# Patient Record
Sex: Male | Born: 1960 | Race: Black or African American | Hispanic: No | Marital: Married | Smoking: Never smoker
Health system: Southern US, Community
[De-identification: ages and names within clinical notes are randomized; demographics above are authoritative.]

## PROBLEM LIST (undated history)

## (undated) DIAGNOSIS — I10 Essential (primary) hypertension: Secondary | ICD-10-CM

## (undated) DIAGNOSIS — E119 Type 2 diabetes mellitus without complications: Secondary | ICD-10-CM

---

## 2008-08-03 ENCOUNTER — Emergency Department (HOSPITAL_COMMUNITY): Admission: EM | Admit: 2008-08-03 | Discharge: 2008-08-03 | Payer: Self-pay | Admitting: Emergency Medicine

## 2016-01-05 ENCOUNTER — Ambulatory Visit (INDEPENDENT_AMBULATORY_CARE_PROVIDER_SITE_OTHER): Payer: PRIVATE HEALTH INSURANCE | Admitting: Family Medicine

## 2016-01-05 VITALS — BP 128/80 | HR 84 | Temp 99.4°F | Resp 16 | Ht 70.0 in | Wt 207.0 lb

## 2016-01-05 DIAGNOSIS — R112 Nausea with vomiting, unspecified: Secondary | ICD-10-CM | POA: Diagnosis not present

## 2016-01-05 DIAGNOSIS — R197 Diarrhea, unspecified: Secondary | ICD-10-CM | POA: Diagnosis not present

## 2016-01-05 MED ORDER — ONDANSETRON 4 MG PO TBDP
4.0000 mg | ORAL_TABLET | Freq: Once | ORAL | Status: AC
  Administered 2016-01-05: 4 mg via ORAL

## 2016-01-05 MED ORDER — ONDANSETRON 4 MG PO TBDP: 4.0000 mg | ORAL_TABLET | Freq: Three times a day (TID) | ORAL | Status: DC | PRN

## 2016-01-05 NOTE — Progress Notes (Addendum)
Subjective:    Patient ID: Benjamin Munoz, male    DOB: 1961-08-23, 55 y.o.   MRN: [id number] By signing my name below, I, Benjamin Munoz, attest that this documentation has been prepared under the direction and in the presence of Benjamin Staggers, MD.  Electronically Signed: Littie Munoz, Medical Scribe. 01/05/2016. 9:55 AM.  HPI HPI Comments: Benjamin Munoz is a 55 y.o. male who presents to the [facility] complaining of vomiting that started 2 nights ago. Patient reports having associated diarrhea and abdominal soreness. He also reports having chills initially. He estimates vomiting 6 times yesterday and had about 8 episodes of diarrhea yesterday. He had a single episode of vomiting today around 3:30 AM and a single episode of diarrhea today around 6:00 AM. His symptoms have been improving overall. Patient has not been able to hold down fluids, but he was able to take small sips of Gatorade today. He has not tried any medications. Patient denies fever, difficulty urinating, and lightheadedness. He last urinated this morning, which was normal. He missed work yesterday due to his symptoms. He is concerned he may had food poisoning from fruit that he ate.  Patient works at [employer].  There are no active problems to display for this patient.  History reviewed. No pertinent past medical history. History reviewed. No pertinent past surgical history. No Known Allergies Prior to Admission medications   Not on File   Social History   Social History  . Marital Status: Married    Spouse Name: N/A  . Number of Children: N/A  . Years of Education: N/A   Occupational History  . Not on file.   Social History Main Topics  . Smoking status: Never Smoker   . Smokeless tobacco: Not on file  . Alcohol Use: Not on file  . Drug Use: Not on file  . Sexual Activity: Not on file   Other Topics Concern  . Not on file   Social History Narrative  . No narrative on file       Review of Systems  Constitutional: Positive for chills. Negative for fever.  Gastrointestinal: Positive for vomiting, abdominal pain and diarrhea.  Genitourinary: Negative for difficulty urinating.  Neurological: Negative for light-headedness.       Objective:   Physical Exam  Constitutional: He is oriented to person, place, and time. He appears well-developed and well-nourished. No distress.  HENT:  Head: Normocephalic and atraumatic.  Mouth/Throat: Oropharynx is clear and moist. No oropharyngeal exudate.  Eyes: Pupils are equal, round, and reactive to light.  Neck: Neck supple.  Cardiovascular: Normal rate, regular rhythm and normal heart sounds.  Exam reveals no gallop and no friction rub.   No murmur heard. Pulmonary/Chest: Effort normal and breath sounds normal. No respiratory distress. He has no wheezes.  Clear to auscultation bilaterally.   Abdominal: There is generalized tenderness. There is no rebound, no guarding and negative Murphy's sign.  Diffuse general slight tenderness, no focal tenderness.   Musculoskeletal: He exhibits no edema.  Neurological: He is alert and oriented to person, place, and time. No cranial nerve deficit.  Skin: Skin is warm and dry. No rash noted.  Psychiatric: He has a normal mood and affect. His behavior is normal.  Nursing note and vitals reviewed.   Filed Vitals:   01/05/16 0929  BP: 128/80  Pulse: 84  Temp: 99.4 F (37.4 C)  TempSrc: Oral  Resp: 16  Height: 5\' 10"  (1.778 m)  Weight:  207 lb (93.895 kg)  SpO2: 97%         Assessment & Plan:   Benjamin Munoz is a 55 y.o. male Non-intractable vomiting with nausea, vomiting of unspecified type - Plan: ondansetron (ZOFRAN ODT) 4 MG disintegrating tablet  Diarrhea, unspecified type  Suspected viral gastroenteritis, with symptomatic improvement today. Appears significantly hydrated.   -Symptomatic care discussed with Zofran if needed, initial dose given in office.   -ORT with  frequent sips of small amounts of fluid, rest and slow resumption of regular diet as improves.   -. RTC precautions if persistent abdominal pain, return of fevers or worsening vomiting, diarrhea, or abdominal pain. Understanding expressed.   Meds ordered this encounter  Medications  . ondansetron (ZOFRAN ODT) 4 MG disintegrating tablet    Sig: Take 1 tablet (4 mg total) by mouth every 8 (eight) hours as needed for nausea or vomiting.    Dispense:  10 tablet    Refill:  0   Patient Instructions  You appear to have a virus causing the vomiting and diarrhea. As it is improving, can hold off on other testing at this time. You can take Zofran up to every 8 hours as needed for nausea and vomiting, small sips of fluids but do this frequently. Out of work for today, and return tomorrow as long as you are continuing to improve. If you are having fevers, increasing abdominal pain, or otherwise worsening, return for recheck here or the emergency room.  Gastroenteritis:  Diarrhea Infections caused by germs (bacterial) or a virus commonly cause diarrhea. Your caregiver has determined that with time, rest and fluids, the diarrhea should improve. In general, eat normally while drinking more water than usual. Although water may prevent dehydration, it does not contain salt and minerals (electrolytes). Broths, weak tea without caffeine and oral rehydration solutions (ORS) replace fluids and electrolytes. Small amounts of fluids should be taken frequently. Large amounts at one time may not be tolerated. Plain water may be harmful in infants and the elderly. Oral rehydrating solutions (ORS) are available at pharmacies and grocery stores. ORS replace water and important electrolytes in proper proportions. Sports drinks are not as effective as ORS and may be harmful due to sugars worsening diarrhea.  ORS is especially recommended for use in children with diarrhea. As a general guideline for children, replace any new  fluid losses from diarrhea and/or vomiting with ORS as follows:   If your child weighs 22 pounds or under (10 kg or less), give 60-120 mL ( -  cup or 2 - 4 ounces) of ORS for each episode of diarrheal stool or vomiting episode.   If your child weighs more than 22 pounds (more than 10 kgs), give 120-240 mL ( - 1 cup or 4 - 8 ounces) of ORS for each diarrheal stool or episode of vomiting.   While correcting for dehydration, children should eat normally. However, foods high in sugar should be avoided because this may worsen diarrhea. Large amounts of carbonated soft drinks, juice, gelatin desserts and other highly sugared drinks should be avoided.   After correction of dehydration, other liquids that are appealing to the child may be added. Children should drink small amounts of fluids frequently and fluids should be increased as tolerated. Children should drink enough fluids to keep urine clear or pale yellow.   Adults should eat normally while drinking more fluids than usual. Drink small amounts of fluids frequently and increase as tolerated. Drink enough fluids to keep  urine clear or pale yellow. Broths, weak decaffeinated tea, lemon lime soft drinks (allowed to go flat) and ORS replace fluids and electrolytes.   Avoid:   Carbonated drinks.   Juice.   Extremely hot or cold fluids.   Caffeine drinks.   Fatty, greasy foods.   Alcohol.   Tobacco.   Too much intake of anything at one time.   Gelatin desserts.   Probiotics are active cultures of beneficial bacteria. They may lessen the amount and number of diarrheal stools in adults. Probiotics can be found in yogurt with active cultures and in supplements.   Wash hands well to avoid spreading bacteria and virus.   Anti-diarrheal medications are not recommended for infants and children.   Only take over-the-counter or prescription medicines for pain, discomfort or fever as directed by your caregiver. Do not give aspirin to  children because it may cause Reye's Syndrome.   For adults, ask your caregiver if you should continue all prescribed and over-the-counter medicines.   If your caregiver has given you a follow-up appointment, it is very important to keep that appointment. Not keeping the appointment could result in a chronic or permanent injury, and disability. If there is any problem keeping the appointment, you must call back to this facility for assistance.  SEEK IMMEDIATE MEDICAL CARE IF:   You or your child is unable to keep fluids down or other symptoms or problems become worse in spite of treatment.   Vomiting or diarrhea develops and becomes persistent.   There is vomiting of blood or bile (green material).   There is blood in the stool or the stools are black and tarry.   There is no urine output in 6-8 hours or there is only a small amount of very dark urine.   Abdominal pain develops, increases or localizes.   You have a fever.   Your baby is older than 3 months with a rectal temperature of 102 F (38.9 C) or higher.   Your baby is 31 months old or younger with a rectal temperature of 100.4 F (38 C) or higher.   You or your child develops excessive weakness, dizziness, fainting or extreme thirst.   You or your child develops a rash, stiff neck, severe headache or become irritable or sleepy and difficult to awaken.  MAKE SURE YOU:   Understand these instructions.   Will watch your condition.   Will get help right away if you are not doing well or get worse.  Document Released: 11/04/2002 Document Revised: 11/03/2011 Document Reviewed: 09/21/2009 [facility] Patient Information 2012 [place], [place].  Nausea and Vomiting Nausea is a sick feeling that often comes before throwing up (vomiting). Vomiting is a reflex where stomach contents come out of your mouth. Vomiting can cause severe loss of body fluids (dehydration). Children and elderly adults can become dehydrated quickly, especially  if they also have diarrhea. Nausea and vomiting are symptoms of a condition or disease. It is important to find the cause of your symptoms. CAUSES   Direct irritation of the stomach lining. This irritation can result from increased acid production (gastroesophageal reflux disease), infection, food poisoning, taking certain medicines (such as nonsteroidal anti-inflammatory drugs), alcohol use, or tobacco use.   Signals from the brain.These signals could be caused by a headache, heat exposure, an inner ear disturbance, increased pressure in the brain from injury, infection, a tumor, or a concussion, pain, emotional stimulus, or metabolic problems.   An obstruction in the gastrointestinal tract (bowel obstruction).  Illnesses such as diabetes, hepatitis, gallbladder problems, appendicitis, kidney problems, cancer, sepsis, atypical symptoms of a heart attack, or eating disorders.   Medical treatments such as chemotherapy and radiation.   Receiving medicine that makes you sleep (general anesthetic) during surgery.  DIAGNOSIS Your caregiver may ask for tests to be done if the problems do not improve after a few days. Tests may also be done if symptoms are severe or if the reason for the nausea and vomiting is not clear. Tests may include:  Urine tests.   Blood tests.   Stool tests.   Cultures (to look for evidence of infection).   X-rays or other imaging studies.  Test results can help your caregiver make decisions about treatment or the need for additional tests. TREATMENT You need to stay well hydrated. Drink frequently but in small amounts.You may wish to drink water, sports drinks, clear broth, or eat frozen ice pops or gelatin dessert to help stay hydrated.When you eat, eating slowly may help prevent nausea.There are also some antinausea medicines that may help prevent nausea. HOME CARE INSTRUCTIONS   Take all medicine as directed by your caregiver.   If you do not have an  appetite, do not force yourself to eat. However, you must continue to drink fluids.   If you have an appetite, eat a normal diet unless your caregiver tells you differently.   Eat a variety of complex carbohydrates (rice, wheat, potatoes, bread), lean meats, yogurt, fruits, and vegetables.   Avoid high-fat foods because they are more difficult to digest.   Drink enough water and fluids to keep your urine clear or pale yellow.   If you are dehydrated, ask your caregiver for specific rehydration instructions. Signs of dehydration may include:   Severe thirst.   Dry lips and mouth.   Dizziness.   Dark urine.   Decreasing urine frequency and amount.   Confusion.   Rapid breathing or pulse.  SEEK IMMEDIATE MEDICAL CARE IF:   You have blood or brown flecks (like coffee grounds) in your vomit.   You have black or bloody stools.   You have a severe headache or stiff neck.   You are confused.   You have severe abdominal pain.   You have chest pain or trouble breathing.   You do not urinate at least once every 8 hours.   You develop cold or clammy skin.   You continue to vomit for longer than 24 to 48 hours.   You have a fever.  MAKE SURE YOU:   Understand these instructions.   Will watch your condition.   Will get help right away if you are not doing well or get worse.  Document Released: 11/14/2005 Document Revised: 11/03/2011 Document Reviewed: 04/13/2011 [facility] Patient Information 2012 [place], [place].  Return to the clinic or go to the nearest emergency room if any of your symptoms worsen or new symptoms occur.      I personally performed the services described in this documentation, which was scribed in my presence. The recorded information has been reviewed and considered, and addended by me as needed.

## 2016-01-05 NOTE — Addendum Note (Signed)
Addended by: Meredith Staggers R on: 01/05/2016 10:47 AM   Modules accepted: Orders

## 2016-01-05 NOTE — Patient Instructions (Signed)
You appear to have a virus causing the vomiting and diarrhea. As it is improving, can hold off on other testing at this time. You can take Zofran up to every 8 hours as needed for nausea and vomiting, small sips of fluids but do this frequently. Out of work for today, and return tomorrow as long as you are continuing to improve. If you are having fevers, increasing abdominal pain, or otherwise worsening, return for recheck here or the emergency room.  Gastroenteritis:  Diarrhea Infections caused by germs (bacterial) or a virus commonly cause diarrhea. Your caregiver has determined that with time, rest and fluids, the diarrhea should improve. In general, eat normally while drinking more water than usual. Although water may prevent dehydration, it does not contain salt and minerals (electrolytes). Broths, weak tea without caffeine and oral rehydration solutions (ORS) replace fluids and electrolytes. Small amounts of fluids should be taken frequently. Large amounts at one time may not be tolerated. Plain water may be harmful in infants and the elderly. Oral rehydrating solutions (ORS) are available at pharmacies and grocery stores. ORS replace water and important electrolytes in proper proportions. Sports drinks are not as effective as ORS and may be harmful due to sugars worsening diarrhea.  ORS is especially recommended for use in children with diarrhea. As a general guideline for children, replace any new fluid losses from diarrhea and/or vomiting with ORS as follows:   If your child weighs 22 pounds or under (10 kg or less), give 60-120 mL ( -  cup or 2 - 4 ounces) of ORS for each episode of diarrheal stool or vomiting episode.   If your child weighs more than 22 pounds (more than 10 kgs), give 120-240 mL ( - 1 cup or 4 - 8 ounces) of ORS for each diarrheal stool or episode of vomiting.   While correcting for dehydration, children should eat normally. However, foods high in sugar should be avoided  because this may worsen diarrhea. Large amounts of carbonated soft drinks, juice, gelatin desserts and other highly sugared drinks should be avoided.   After correction of dehydration, other liquids that are appealing to the child may be added. Children should drink small amounts of fluids frequently and fluids should be increased as tolerated. Children should drink enough fluids to keep urine clear or pale yellow.   Adults should eat normally while drinking more fluids than usual. Drink small amounts of fluids frequently and increase as tolerated. Drink enough fluids to keep urine clear or pale yellow. Broths, weak decaffeinated tea, lemon lime soft drinks (allowed to go flat) and ORS replace fluids and electrolytes.   Avoid:   Carbonated drinks.   Juice.   Extremely hot or cold fluids.   Caffeine drinks.   Fatty, greasy foods.   Alcohol.   Tobacco.   Too much intake of anything at one time.   Gelatin desserts.   Probiotics are active cultures of beneficial bacteria. They may lessen the amount and number of diarrheal stools in adults. Probiotics can be found in yogurt with active cultures and in supplements.   Wash hands well to avoid spreading bacteria and virus.   Anti-diarrheal medications are not recommended for infants and children.   Only take over-the-counter or prescription medicines for pain, discomfort or fever as directed by your caregiver. Do not give aspirin to children because it may cause Reye's Syndrome.   For adults, ask your caregiver if you should continue all prescribed and over-the-counter medicines.   If  your caregiver has given you a follow-up appointment, it is very important to keep that appointment. Not keeping the appointment could result in a chronic or permanent injury, and disability. If there is any problem keeping the appointment, you must call back to this facility for assistance.  SEEK IMMEDIATE MEDICAL CARE IF:   You or your child is unable  to keep fluids down or other symptoms or problems become worse in spite of treatment.   Vomiting or diarrhea develops and becomes persistent.   There is vomiting of blood or bile (green material).   There is blood in the stool or the stools are black and tarry.   There is no urine output in 6-8 hours or there is only a small amount of very dark urine.   Abdominal pain develops, increases or localizes.   You have a fever.   Your baby is older than 3 months with a rectal temperature of 102 F (38.9 C) or higher.   Your baby is 51 months old or younger with a rectal temperature of 100.4 F (38 C) or higher.   You or your child develops excessive weakness, dizziness, fainting or extreme thirst.   You or your child develops a rash, stiff neck, severe headache or become irritable or sleepy and difficult to awaken.  MAKE SURE YOU:   Understand these instructions.   Will watch your condition.   Will get help right away if you are not doing well or get worse.  Document Released: 11/04/2002 Document Revised: 11/03/2011 Document Reviewed: 09/21/2009 [facility] Patient Information 2012 [place], [place].  Nausea and Vomiting Nausea is a sick feeling that often comes before throwing up (vomiting). Vomiting is a reflex where stomach contents come out of your mouth. Vomiting can cause severe loss of body fluids (dehydration). Children and elderly adults can become dehydrated quickly, especially if they also have diarrhea. Nausea and vomiting are symptoms of a condition or disease. It is important to find the cause of your symptoms. CAUSES   Direct irritation of the stomach lining. This irritation can result from increased acid production (gastroesophageal reflux disease), infection, food poisoning, taking certain medicines (such as nonsteroidal anti-inflammatory drugs), alcohol use, or tobacco use.   Signals from the brain.These signals could be caused by a headache, heat exposure, an inner ear  disturbance, increased pressure in the brain from injury, infection, a tumor, or a concussion, pain, emotional stimulus, or metabolic problems.   An obstruction in the gastrointestinal tract (bowel obstruction).   Illnesses such as diabetes, hepatitis, gallbladder problems, appendicitis, kidney problems, cancer, sepsis, atypical symptoms of a heart attack, or eating disorders.   Medical treatments such as chemotherapy and radiation.   Receiving medicine that makes you sleep (general anesthetic) during surgery.  DIAGNOSIS Your caregiver may ask for tests to be done if the problems do not improve after a few days. Tests may also be done if symptoms are severe or if the reason for the nausea and vomiting is not clear. Tests may include:  Urine tests.   Blood tests.   Stool tests.   Cultures (to look for evidence of infection).   X-rays or other imaging studies.  Test results can help your caregiver make decisions about treatment or the need for additional tests. TREATMENT You need to stay well hydrated. Drink frequently but in small amounts.You may wish to drink water, sports drinks, clear broth, or eat frozen ice pops or gelatin dessert to help stay hydrated.When you eat, eating slowly may help  prevent nausea.There are also some antinausea medicines that may help prevent nausea. HOME CARE INSTRUCTIONS   Take all medicine as directed by your caregiver.   If you do not have an appetite, do not force yourself to eat. However, you must continue to drink fluids.   If you have an appetite, eat a normal diet unless your caregiver tells you differently.   Eat a variety of complex carbohydrates (rice, wheat, potatoes, bread), lean meats, yogurt, fruits, and vegetables.   Avoid high-fat foods because they are more difficult to digest.   Drink enough water and fluids to keep your urine clear or pale yellow.   If you are dehydrated, ask your caregiver for specific rehydration instructions.  Signs of dehydration may include:   Severe thirst.   Dry lips and mouth.   Dizziness.   Dark urine.   Decreasing urine frequency and amount.   Confusion.   Rapid breathing or pulse.  SEEK IMMEDIATE MEDICAL CARE IF:   You have blood or brown flecks (like coffee grounds) in your vomit.   You have black or bloody stools.   You have a severe headache or stiff neck.   You are confused.   You have severe abdominal pain.   You have chest pain or trouble breathing.   You do not urinate at least once every 8 hours.   You develop cold or clammy skin.   You continue to vomit for longer than 24 to 48 hours.   You have a fever.  MAKE SURE YOU:   Understand these instructions.   Will watch your condition.   Will get help right away if you are not doing well or get worse.  Document Released: 11/14/2005 Document Revised: 11/03/2011 Document Reviewed: 04/13/2011 [facility] Patient Information 2012 [place], [place].  Return to the clinic or go to the nearest emergency room if any of your symptoms worsen or new symptoms occur.

## 2016-01-11 ENCOUNTER — Encounter: Payer: Self-pay | Admitting: *Deleted

## 2018-01-09 ENCOUNTER — Encounter (HOSPITAL_COMMUNITY): Payer: Self-pay

## 2018-01-09 ENCOUNTER — Emergency Department (HOSPITAL_COMMUNITY)
Admission: EM | Admit: 2018-01-09 | Discharge: 2018-01-09 | Disposition: A | Discharge status: Home / Self Care | Payer: No Typology Code available for payment source | Attending: Emergency Medicine | Admitting: Emergency Medicine

## 2018-01-09 DIAGNOSIS — Y92410 Unspecified street and highway as the place of occurrence of the external cause: Secondary | ICD-10-CM | POA: Diagnosis not present

## 2018-01-09 DIAGNOSIS — S299XXA Unspecified injury of thorax, initial encounter: Secondary | ICD-10-CM | POA: Diagnosis not present

## 2018-01-09 DIAGNOSIS — Y998 Other external cause status: Secondary | ICD-10-CM | POA: Diagnosis not present

## 2018-01-09 DIAGNOSIS — T148XXA Other injury of unspecified body region, initial encounter: Secondary | ICD-10-CM

## 2018-01-09 DIAGNOSIS — Y9389 Activity, other specified: Secondary | ICD-10-CM | POA: Diagnosis not present

## 2018-01-09 DIAGNOSIS — W2210XA Striking against or struck by unspecified automobile airbag, initial encounter: Secondary | ICD-10-CM | POA: Insufficient documentation

## 2018-01-09 DIAGNOSIS — S46912A Strain of unspecified muscle, fascia and tendon at shoulder and upper arm level, left arm, initial encounter: Secondary | ICD-10-CM | POA: Insufficient documentation

## 2018-01-09 DIAGNOSIS — S4992XA Unspecified injury of left shoulder and upper arm, initial encounter: Secondary | ICD-10-CM | POA: Diagnosis present

## 2018-01-09 MED ORDER — CYCLOBENZAPRINE HCL 10 MG PO TABS
5.0000 mg | ORAL_TABLET | Freq: Once | ORAL | Status: AC
  Administered 2018-01-09: 5 mg via ORAL
  Filled 2018-01-09: qty 1

## 2018-01-09 MED ORDER — IBUPROFEN 600 MG PO TABS: 600.0000 mg | ORAL_TABLET | Freq: Four times a day (QID) | ORAL | 0 refills | Status: DC | PRN

## 2018-01-09 MED ORDER — ACETAMINOPHEN 500 MG PO TABS
1000.0000 mg | ORAL_TABLET | Freq: Once | ORAL | Status: AC
  Administered 2018-01-09: 1000 mg via ORAL
  Filled 2018-01-09: qty 2

## 2018-01-09 MED ORDER — CYCLOBENZAPRINE HCL 10 MG PO TABS: 5.0000 mg | ORAL_TABLET | Freq: Every day | ORAL | 0 refills | Status: AC

## 2018-01-09 MED ORDER — ACETAMINOPHEN 500 MG PO TABS: 1000.0000 mg | ORAL_TABLET | Freq: Three times a day (TID) | ORAL | 0 refills | Status: AC

## 2018-01-09 NOTE — Discharge Instructions (Signed)
You may use over-the-counter Motrin (Ibuprofen), Acetaminophen (Tylenol), topical muscle creams such as SalonPas, Icy Hot, Bengay, etc. Please stretch, apply heat, and have massage therapy for additional assistance. ° °

## 2018-01-09 NOTE — ED Notes (Signed)
Pt A&Ox4, ambulatory at d/c with independent steady gait, NAD. Pt states his wife will be driving him home. Pt states he has all of his belongings with him at d/c
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
## 2019-02-07 ENCOUNTER — Encounter (HOSPITAL_COMMUNITY): Payer: Self-pay

## 2019-02-07 ENCOUNTER — Ambulatory Visit (HOSPITAL_COMMUNITY)
Admission: EM | Admit: 2019-02-07 | Discharge: 2019-02-07 | Disposition: A | Discharge status: Home / Self Care | Payer: BLUE CROSS/BLUE SHIELD | Attending: Urgent Care | Admitting: Urgent Care

## 2019-02-07 DIAGNOSIS — R05 Cough: Secondary | ICD-10-CM

## 2019-02-07 DIAGNOSIS — J111 Influenza due to unidentified influenza virus with other respiratory manifestations: Secondary | ICD-10-CM

## 2019-02-07 DIAGNOSIS — R52 Pain, unspecified: Secondary | ICD-10-CM

## 2019-02-07 DIAGNOSIS — R059 Cough, unspecified: Secondary | ICD-10-CM

## 2019-02-07 DIAGNOSIS — R69 Illness, unspecified: Secondary | ICD-10-CM

## 2019-02-07 MED ORDER — ACETAMINOPHEN 325 MG PO TABS: 650.0000 mg | ORAL_TABLET | Freq: Once | ORAL | Status: DC

## 2019-02-07 MED ORDER — OSELTAMIVIR PHOSPHATE 75 MG PO CAPS: 75.0000 mg | ORAL_CAPSULE | Freq: Two times a day (BID) | ORAL | 0 refills | Status: DC

## 2019-02-07 MED ORDER — ACETAMINOPHEN 325 MG PO TABS
ORAL_TABLET | ORAL | Status: AC
  Filled 2019-02-07: qty 2

## 2019-02-07 MED ORDER — BENZONATATE 100 MG PO CAPS: 100.0000 mg | ORAL_CAPSULE | Freq: Three times a day (TID) | ORAL | 0 refills | Status: DC | PRN

## 2019-02-07 NOTE — ED Provider Notes (Signed)
MRN: [id number] DOB: [birth year]  Subjective:   Benjamin Munoz is a 58 y.o. male presenting for 2 day history of acute onset moderate worsening malaise, body aches. Has tried Mucinex with minimal relief. He is not currently taking any medications and has no known food or drug allergies.  Denies past medical and surgical history. Denies smoking cigarettes.   Review of Systems  Constitutional: Positive for fever and malaise/fatigue.  HENT: Positive for congestion, ear pain and sore throat. Negative for sinus pain.   Eyes: Negative for blurred vision, double vision, discharge and redness.  Respiratory: Positive for cough (dry). Negative for hemoptysis, shortness of breath and wheezing.   Cardiovascular: Positive for chest pain (from coughing).  Gastrointestinal: Negative for abdominal pain, diarrhea, nausea and vomiting.  Genitourinary: Negative for dysuria, flank pain and hematuria.  Musculoskeletal: Positive for myalgias.  Skin: Negative for rash.  Neurological: Positive for headaches. Negative for weakness.  Psychiatric/Behavioral: Negative for depression and substance abuse.    Objective:   Vitals: BP 140/83 (BP Location: Left Arm)   Pulse 91   Temp 100.3 F (37.9 C) (Oral)   Resp 16   SpO2 98%   Physical Exam Constitutional:      General: He is not in acute distress.    Appearance: Normal appearance. He is well-developed and normal weight. He is not ill-appearing.  HENT:     Head: Normocephalic and atraumatic.     Right Ear: Tympanic membrane, ear canal and external ear normal. There is no impacted cerumen.     Left Ear: Tympanic membrane, ear canal and external ear normal. There is no impacted cerumen.     Nose: Nose normal. No congestion or rhinorrhea.     Mouth/Throat:     Mouth: Mucous membranes are moist.     Pharynx: Oropharynx is clear. No oropharyngeal exudate or posterior oropharyngeal erythema.  Eyes:     General: No scleral icterus.       Right eye: No  discharge.        Left eye: No discharge.     Extraocular Movements: Extraocular movements intact.     Conjunctiva/sclera: Conjunctivae normal.     Pupils: Pupils are equal, round, and reactive to light.  Neck:     Musculoskeletal: Normal range of motion and neck supple. No neck rigidity or muscular tenderness.  Cardiovascular:     Rate and Rhythm: Normal rate and regular rhythm.     Heart sounds: No murmur. No friction rub. No gallop.   Pulmonary:     Effort: Pulmonary effort is normal. No respiratory distress.     Breath sounds: No wheezing or rales.  Abdominal:     General: Bowel sounds are normal. There is no distension.     Palpations: Abdomen is soft. There is no mass.     Tenderness: There is no abdominal tenderness. There is no guarding or rebound.  Skin:    General: Skin is warm and dry.  Neurological:     General: No focal deficit present.     Mental Status: He is alert and oriented to person, place, and time.  Psychiatric:        Mood and Affect: Mood normal.        Behavior: Behavior normal.     Assessment and Plan :   Influenza-like illness  Generalized body aches  Cough  Will cover for influenza with Tamiflu given symptom set, acute onset, physical exam findings.  Use supportive care, rest, fluids, hydration, light meals,  schedule Tylenol and ibuprofen. Counseled patient on potential for adverse effects with medications prescribed today, patient verbalized understanding. ER and return-to-clinic precautions discussed, patient verbalized understanding.    Wallis Bamberg, PA-C 02/07/19 1043

## 2019-02-07 NOTE — Discharge Instructions (Addendum)
We will manage this as a viral syndrome. For sore throat or cough try using a honey-based tea. Use 3 teaspoons of honey with juice squeezed from half lemon. Place shaved pieces of ginger into 1/2-1 cup of water and warm over stove top. Then mix the ingredients and repeat every 4 hours as needed. Please take ibuprofen 400mg  every 6 hours alternating with OR taken together with Tylenol 500mg  every 6 hours. Hydrate very well with at least 2 liters of water. Eat light meals such as soups to replenish electrolytes and soft fruits, veggies. Start an antihistamine like Zyrtec, Allegra or Claritin for nasal congestion. You may pick up over-the-counter pseudoephedrine (Sudafed) and use this for post-nasal drainage, sinus congestion at a dose of 60mg  every 8 hours or 120mg  every 12 hours as needed.

## 2019-02-07 NOTE — ED Triage Notes (Signed)
Pt presents with congestion, headache, and general body aches X 2 days.

## 2020-11-01 ENCOUNTER — Emergency Department (HOSPITAL_COMMUNITY): Payer: BLUE CROSS/BLUE SHIELD

## 2020-11-01 ENCOUNTER — Other Ambulatory Visit: Payer: Self-pay

## 2020-11-01 ENCOUNTER — Encounter (HOSPITAL_COMMUNITY): Payer: Self-pay | Admitting: Emergency Medicine

## 2020-11-01 ENCOUNTER — Emergency Department (HOSPITAL_COMMUNITY)
Admission: EM | Admit: 2020-11-01 | Discharge: 2020-11-01 | Disposition: A | Discharge status: Home / Self Care | Payer: BLUE CROSS/BLUE SHIELD | Source: Home / Self Care | Attending: Emergency Medicine | Admitting: Emergency Medicine

## 2020-11-01 DIAGNOSIS — U071 COVID-19: Secondary | ICD-10-CM

## 2020-11-01 DIAGNOSIS — H6121 Impacted cerumen, right ear: Secondary | ICD-10-CM | POA: Insufficient documentation

## 2020-11-01 DIAGNOSIS — R0602 Shortness of breath: Secondary | ICD-10-CM | POA: Diagnosis not present

## 2020-11-01 LAB — COMPREHENSIVE METABOLIC PANEL
ALT: 33 U/L (ref 0–44)
AST: 41 U/L (ref 15–41)
Albumin: 3.5 g/dL (ref 3.5–5.0)
Alkaline Phosphatase: 46 U/L (ref 38–126)
Anion gap: 11 (ref 5–15)
BUN: 9 mg/dL (ref 6–20)
CO2: 23 mmol/L (ref 22–32)
Calcium: 8.6 mg/dL — ABNORMAL LOW (ref 8.9–10.3)
Chloride: 99 mmol/L (ref 98–111)
Creatinine, Ser: 1.1 mg/dL (ref 0.61–1.24)
GFR, Estimated: >60 mL/min (ref >60)
Glucose, Bld: 125 mg/dL — ABNORMAL HIGH (ref 70–99)
Potassium: 4 mmol/L (ref 3.5–5.1)
Sodium: 133 mmol/L — ABNORMAL LOW (ref 135–145)
Total Bilirubin: 0.7 mg/dL (ref 0.3–1.2)
Total Protein: 7.5 g/dL (ref 6.5–8.1)

## 2020-11-01 LAB — CBC WITH DIFFERENTIAL/PLATELET
Abs Immature Granulocytes: 0.02 10*3/uL (ref 0.00–0.07)
Basophils Absolute: 0 10*3/uL (ref 0.0–0.1)
Basophils Relative: 0 %
Eosinophils Absolute: 0 10*3/uL (ref 0.0–0.5)
Eosinophils Relative: 0 %
HCT: 45.6 % (ref 39.0–52.0)
Hemoglobin: 15.1 g/dL (ref 13.0–17.0)
Immature Granulocytes: 0 %
Lymphocytes Relative: 30 %
Lymphs Abs: 2.2 10*3/uL (ref 0.7–4.0)
MCH: 28.9 pg (ref 26.0–34.0)
MCHC: 33.1 g/dL (ref 30.0–36.0)
MCV: 87.4 fL (ref 80.0–100.0)
Monocytes Absolute: 0.3 10*3/uL (ref 0.1–1.0)
Monocytes Relative: 5 %
Neutro Abs: 4.6 10*3/uL (ref 1.7–7.7)
Neutrophils Relative %: 65 %
Platelets: 155 10*3/uL (ref 150–400)
RBC: 5.22 MIL/uL (ref 4.22–5.81)
RDW: 13.5 % (ref 11.5–15.5)
WBC: 7.2 10*3/uL (ref 4.0–10.5)
nRBC: 0 % (ref 0.0–0.2)

## 2020-11-01 LAB — RESP PANEL BY RT-PCR (FLU A&B, COVID) ARPGX2
Influenza A by PCR: NEGATIVE
Influenza B by PCR: NEGATIVE
SARS Coronavirus 2 by RT PCR: POSITIVE — AB

## 2020-11-01 LAB — LACTIC ACID, PLASMA: Lactic Acid, Venous: 1 mmol/L (ref 0.5–1.9)

## 2020-11-01 MED ORDER — ACETAMINOPHEN 325 MG PO TABS
650.0000 mg | ORAL_TABLET | Freq: Once | ORAL | Status: AC | PRN
  Administered 2020-11-01: 650 mg via ORAL
  Filled 2020-11-01: qty 2

## 2020-11-01 NOTE — ED Triage Notes (Signed)
C/o fever, chills, body aches, loss of taste and smell x 1 week.  Last Tylenol last night.  No known COVID contacts.

## 2020-11-01 NOTE — ED Provider Notes (Signed)
[facility] EMERGENCY DEPARTMENT Provider Note   CSN: [id number] Arrival date & time: 11/01/20  1941     History Chief Complaint  Patient presents with  . COVID testing  . Chills    Benjamin Munoz is a 59 y.o. male.  HPI   Patient with no significant medical history presents to the emergency department with chief complaint of URI-like symptoms.  Patient endorses symptoms started last Sunday he has subjective fevers and chills, a slight productive cough, chest congestion, diarrhea but denies ear pain, nasal congestion, chest pain, shortness of breath, abdominal pain, nausea, vomiting, general body aches.  He states he is not vaccine against COVID-19, denies recent sick contacts, no recent travels.  Patient denies any alleviating factors at this time.   History reviewed. No pertinent past medical history.  There are no problems to display for this patient.   History reviewed. No pertinent surgical history.     No family history on file.  Social History   Tobacco Use  . Smoking status: Never Smoker  . Smokeless tobacco: Never Used  Substance Use Topics  . Alcohol use: No  . Drug use: No    Home Medications Prior to Admission medications   Medication Sig Start Date End Date Taking? Authorizing Provider  benzonatate (TESSALON) 100 MG capsule Take 1-2 capsules (100-200 mg total) by mouth 3 (three) times daily as needed. 02/07/19   Wallis Bamberg, PA-C  oseltamivir (TAMIFLU) 75 MG capsule Take 1 capsule (75 mg total) by mouth 2 (two) times daily. 02/07/19   Wallis Bamberg, PA-C    Allergies    Patient has no known allergies.  Review of Systems   Review of Systems  Constitutional: Positive for appetite change, chills and fever.  HENT: Negative for congestion and sore throat.   Eyes: Negative for visual disturbance.  Respiratory: Positive for cough. Negative for shortness of breath.   Cardiovascular: Negative for chest pain.  Gastrointestinal: Positive  for diarrhea. Negative for abdominal pain, nausea and vomiting.  Genitourinary: Negative for enuresis and flank pain.  Musculoskeletal: Negative for back pain and myalgias.  Skin: Negative for rash.  Neurological: Negative for headaches.  Hematological: Negative for adenopathy.    Physical Exam Updated Vital Signs BP 121/81   Pulse (!) 105   Temp (!) 100.8 F (38.2 C) (Oral)   Resp 20   Ht 5\' 9"  (1.753 m)   Wt 95.3 kg   SpO2 97%   BMI 31.01 kg/m   Physical Exam Vitals and nursing note reviewed.  Constitutional:      General: He is not in acute distress.    Appearance: He is not ill-appearing.  HENT:     Head: Normocephalic and atraumatic.     Right Ear: There is impacted cerumen.     Left Ear: Tympanic membrane, ear canal and external ear normal.     Nose: No congestion.     Mouth/Throat:     Mouth: Mucous membranes are moist.     Pharynx: Oropharynx is clear. No oropharyngeal exudate or posterior oropharyngeal erythema.  Eyes:     Conjunctiva/sclera: Conjunctivae normal.  Cardiovascular:     Rate and Rhythm: Regular rhythm. Tachycardia present.     Pulses: Normal pulses.     Heart sounds: No murmur heard.  No friction rub. No gallop.   Pulmonary:     Effort: No respiratory distress.     Breath sounds: No wheezing, rhonchi or rales.  Abdominal:     Palpations:  Abdomen is soft.     Tenderness: There is no guarding.  Musculoskeletal:     Right lower leg: No edema.     Left lower leg: No edema.  Skin:    General: Skin is warm and dry.  Neurological:     Mental Status: He is alert.  Psychiatric:        Mood and Affect: Mood normal.     ED Results / Procedures / Treatments   Labs (all labs ordered are listed, but only abnormal results are displayed) Labs Reviewed  RESP PANEL BY RT-PCR (FLU A&B, COVID) ARPGX2 - Abnormal; Notable for the following components:      Result Value   SARS Coronavirus 2 by RT PCR POSITIVE (*)    All other components within normal  limits  COMPREHENSIVE METABOLIC PANEL - Abnormal; Notable for the following components:   Sodium 133 (*)    Glucose, Bld 125 (*)    Calcium 8.6 (*)    All other components within normal limits  LACTIC ACID, PLASMA  CBC WITH DIFFERENTIAL/PLATELET  LACTIC ACID, PLASMA    EKG None  Radiology DG Chest Portable 1 View  Result Date: 11/01/2020 CLINICAL DATA:  Fever, chills, body aches. EXAM: PORTABLE CHEST 1 VIEW COMPARISON:  None. FINDINGS: Borderline cardiomegaly. Subtle patchy ground-glass opacities bilaterally. No pleural effusion or pneumothorax is seen. Osseous structures about the chest are unremarkable. IMPRESSION: 1. Subtle patchy ground-glass opacities bilaterally, suspicious for multifocal pneumonia. 2. Borderline cardiomegaly. Electronically Signed   By: Bary Brandonlee M.D.   On: 11/01/2020 10:33    Procedures Procedures (including critical care time)  Medications Ordered in ED Medications  acetaminophen (TYLENOL) tablet 650 mg (650 mg Oral Given 11/01/20 2025)    ED Course  I have reviewed the triage vital signs and the nursing notes.  Pertinent labs & imaging results that were available during my care of the patient were reviewed by me and considered in my medical decision making (see chart for details).    MDM Rules/Calculators/A&P                          Patient presents with URI-like symptoms.  He was alert, does not appear in acute distress, vital signs sent for tachycardia.  Will obtain basic lab work, chest x-ray provide him Tylenol and reevaluate.  CBC negative for leukocytosis times anemia, CMP shows slight hyponatremia of 133, hyperglycemia of 125, no AKI, no elevated liver enzymes, no anion gap present.  Respiratory panel positive for COVID-19, lactic 1.0.  Chest x-ray shows supple patchy groundglass opacities bilaterally suspicious for multifocal pneumonia.  Low suspicion for systemic infection as patient is nontoxic-appearing, no obvious source infection  noted on exam. I have low suspicion for PE as patient denies pleuritic chest pain, shortness of breath.  Suspect tachycardia and slight tachypnea secondary to viral infection as he is Covid positive with chest x-ray showing multifocal pneumonia.Marland Kitchen low suspicion for strep throat as oropharynx was visualized, no erythema or exudates noted.  Low suspicion patient would need  hospitalized due to  Covid as vital signs reassuring, patient is not in respiratory distress.  Suspect patient symptoms are secondary to Covid infection, he has an elevated BMI will refer him to the infusion clinic as I feel he will benefit from it.  We will have him follow-up with post Covid care.  Vital signs have remained stable, no indication for hospital admission.   Patient given at home care as  well strict return precautions.  Patient verbalized that they understood agreed to said plan.   Final Clinical Impression(s) / ED Diagnoses Final diagnoses:  COVID    Rx / DC Orders ED Discharge Orders    None       Carroll Sage, PA-C 11/01/20 1217    Eber Hong, MD 11/02/20 [phone]

## 2020-11-01 NOTE — Discharge Instructions (Addendum)
You have been seen here for URI like symptoms.  Imaging and test shows that you have Covid.  I recommend taking Tylenol for fever control and ibuprofen for pain control please follow dosing on the back of bottle.  I recommend staying hydrated and if you do not an appetite, I recommend soups as this will provide you with fluids and calories. you are Covid positive you must self quarantine for 10 days starting on symptom onset.  I would like you to contact "post Covid care" as they will provide you with information how to manage your Covid symptoms.  The infusion center will contact you to schedule your infusion.  Come back to the emergency department if you develop chest pain, shortness of breath, severe abdominal pain, uncontrolled nausea, vomiting, diarrhea.

## 2020-11-02 ENCOUNTER — Telehealth (HOSPITAL_COMMUNITY): Payer: Self-pay | Admitting: *Deleted

## 2020-11-02 ENCOUNTER — Telehealth: Payer: Self-pay | Admitting: Oncology

## 2020-11-02 NOTE — Telephone Encounter (Signed)
Called to Discuss with patient about Covid symptoms and the use of the monoclonal antibody infusion for those with mild to moderate Covid symptoms and at a high risk of hospitalization.     Pt appears to qualify for this infusion due to co-morbid conditions and/or a member of an at-risk group in accordance with the FDA Emergency Use Authorization.    Pt started having symptoms 11/01/20 and tested postive on 11/01/20 at [facility] ED. His symptoms include loss of taste and a cough. Pt states he is 79ft 9in and weighs 210lbs, which puts his BMI at 31.  Pt notified that he will be contacted by an APP.  Pt provided with CPT and REV codes to check with insurance for coverage.

## 2020-11-02 NOTE — Telephone Encounter (Signed)
Re: [facility]  Called to Discuss with patient about Covid symptoms and the use of regeneron, a monoclonal antibody infusion for those with mild to moderate Covid symptoms and at a high risk of hospitalization.     Pt is qualified for this infusion at the [facility] infusion center due to co-morbid conditions and/or a member of an at-risk group.    No past medical history on file.  Specific risk condition-BMI 31   Unable to reach pt. Left VM and sent text message  Mignon Pine, AGNP-C [phone] [facility])

## 2020-11-04 ENCOUNTER — Inpatient Hospital Stay (HOSPITAL_COMMUNITY)
Admission: EM | Admit: 2020-11-04 | Discharge: 2020-11-13 | DRG: 177 | Disposition: A | Discharge status: Home Health | Payer: BLUE CROSS/BLUE SHIELD | Attending: Internal Medicine | Admitting: Internal Medicine

## 2020-11-04 ENCOUNTER — Emergency Department (HOSPITAL_COMMUNITY): Payer: BLUE CROSS/BLUE SHIELD

## 2020-11-04 DIAGNOSIS — Z6831 Body mass index (BMI) 31.0-31.9, adult: Secondary | ICD-10-CM

## 2020-11-04 DIAGNOSIS — J1282 Pneumonia due to coronavirus disease 2019: Secondary | ICD-10-CM | POA: Diagnosis present

## 2020-11-04 DIAGNOSIS — E041 Nontoxic single thyroid nodule: Secondary | ICD-10-CM | POA: Diagnosis present

## 2020-11-04 DIAGNOSIS — U071 COVID-19: Secondary | ICD-10-CM | POA: Diagnosis present

## 2020-11-04 DIAGNOSIS — J9601 Acute respiratory failure with hypoxia: Secondary | ICD-10-CM | POA: Diagnosis present

## 2020-11-04 DIAGNOSIS — E669 Obesity, unspecified: Secondary | ICD-10-CM | POA: Diagnosis present

## 2020-11-04 DIAGNOSIS — U099 Post covid-19 condition, unspecified: Secondary | ICD-10-CM

## 2020-11-04 DIAGNOSIS — T380X5A Adverse effect of glucocorticoids and synthetic analogues, initial encounter: Secondary | ICD-10-CM | POA: Diagnosis not present

## 2020-11-04 DIAGNOSIS — R0609 Other forms of dyspnea: Secondary | ICD-10-CM

## 2020-11-04 DIAGNOSIS — R0602 Shortness of breath: Secondary | ICD-10-CM

## 2020-11-04 DIAGNOSIS — E1165 Type 2 diabetes mellitus with hyperglycemia: Secondary | ICD-10-CM | POA: Diagnosis not present

## 2020-11-04 DIAGNOSIS — N179 Acute kidney failure, unspecified: Secondary | ICD-10-CM | POA: Diagnosis not present

## 2020-11-04 DIAGNOSIS — E119 Type 2 diabetes mellitus without complications: Secondary | ICD-10-CM

## 2020-11-04 DIAGNOSIS — R7989 Other specified abnormal findings of blood chemistry: Secondary | ICD-10-CM | POA: Diagnosis not present

## 2020-11-04 HISTORY — DX: Other forms of dyspnea: R06.09

## 2020-11-04 HISTORY — DX: Post covid-19 condition, unspecified: U09.9

## 2020-11-04 HISTORY — DX: Essential (primary) hypertension: I10

## 2020-11-04 HISTORY — DX: Type 2 diabetes mellitus without complications: E11.9

## 2020-11-04 LAB — CBC WITH DIFFERENTIAL/PLATELET
Abs Immature Granulocytes: 0.09 10*3/uL — ABNORMAL HIGH (ref 0.00–0.07)
Basophils Absolute: 0 10*3/uL (ref 0.0–0.1)
Basophils Relative: 0 %
Eosinophils Absolute: 0 10*3/uL (ref 0.0–0.5)
Eosinophils Relative: 0 %
HCT: 45.8 % (ref 39.0–52.0)
Hemoglobin: 14.8 g/dL (ref 13.0–17.0)
Immature Granulocytes: 1 %
Lymphocytes Relative: 19 %
Lymphs Abs: 2 10*3/uL (ref 0.7–4.0)
MCH: 28.2 pg (ref 26.0–34.0)
MCHC: 32.3 g/dL (ref 30.0–36.0)
MCV: 87.4 fL (ref 80.0–100.0)
Monocytes Absolute: 0.6 10*3/uL (ref 0.1–1.0)
Monocytes Relative: 5 %
Neutro Abs: 7.9 10*3/uL — ABNORMAL HIGH (ref 1.7–7.7)
Neutrophils Relative %: 75 %
Platelets: 221 10*3/uL (ref 150–400)
RBC: 5.24 MIL/uL (ref 4.22–5.81)
RDW: 13.7 % (ref 11.5–15.5)
WBC: 10.6 10*3/uL — ABNORMAL HIGH (ref 4.0–10.5)
nRBC: 0 % (ref 0.0–0.2)

## 2020-11-04 LAB — D-DIMER, QUANTITATIVE: D-Dimer, Quant: 1.73 ug/mL-FEU — ABNORMAL HIGH (ref 0.00–0.50)

## 2020-11-04 LAB — COMPREHENSIVE METABOLIC PANEL
ALT: 84 U/L — ABNORMAL HIGH (ref 0–44)
AST: 123 U/L — ABNORMAL HIGH (ref 15–41)
Albumin: 3 g/dL — ABNORMAL LOW (ref 3.5–5.0)
Alkaline Phosphatase: 47 U/L (ref 38–126)
Anion gap: 13 (ref 5–15)
BUN: 14 mg/dL (ref 6–20)
CO2: 20 mmol/L — ABNORMAL LOW (ref 22–32)
Calcium: 8.2 mg/dL — ABNORMAL LOW (ref 8.9–10.3)
Chloride: 100 mmol/L (ref 98–111)
Creatinine, Ser: 1.2 mg/dL (ref 0.61–1.24)
GFR, Estimated: >60 mL/min (ref >60)
Glucose, Bld: 125 mg/dL — ABNORMAL HIGH (ref 70–99)
Potassium: 4.5 mmol/L (ref 3.5–5.1)
Sodium: 133 mmol/L — ABNORMAL LOW (ref 135–145)
Total Bilirubin: 0.8 mg/dL (ref 0.3–1.2)
Total Protein: 7.1 g/dL (ref 6.5–8.1)

## 2020-11-04 LAB — FIBRINOGEN: Fibrinogen: 648 mg/dL — ABNORMAL HIGH (ref 210–475)

## 2020-11-04 LAB — BRAIN NATRIURETIC PEPTIDE: B Natriuretic Peptide: 44.4 pg/mL (ref 0.0–100.0)

## 2020-11-04 LAB — FERRITIN: Ferritin: 2012 ng/mL — ABNORMAL HIGH (ref 24–336)

## 2020-11-04 LAB — LACTIC ACID, PLASMA
Lactic Acid, Venous: 1.4 mmol/L (ref 0.5–1.9)
Lactic Acid, Venous: 1.3 mmol/L (ref 0.5–1.9)

## 2020-11-04 LAB — C-REACTIVE PROTEIN: CRP: 16.4 mg/dL — ABNORMAL HIGH (ref <1.0)

## 2020-11-04 LAB — LACTATE DEHYDROGENASE: LDH: 560 U/L — ABNORMAL HIGH (ref 98–192)

## 2020-11-04 LAB — TRIGLYCERIDES: Triglycerides: 131 mg/dL (ref <150)

## 2020-11-04 MED ORDER — METHYLPREDNISOLONE SODIUM SUCC 125 MG IJ SOLR
1.0000 mg/kg | Freq: Two times a day (BID) | INTRAMUSCULAR | Status: DC
  Administered 2020-11-05 – 2020-11-11 (×13): 95 mg via INTRAVENOUS
  Filled 2020-11-04 (×13): qty 2

## 2020-11-04 MED ORDER — ONDANSETRON HCL 4 MG PO TABS: 4.0000 mg | ORAL_TABLET | Freq: Four times a day (QID) | ORAL | Status: DC | PRN

## 2020-11-04 MED ORDER — HYDROCOD POLST-CPM POLST ER 10-8 MG/5ML PO SUER: 5.0000 mL | Freq: Two times a day (BID) | ORAL | Status: DC | PRN

## 2020-11-04 MED ORDER — METHYLPREDNISOLONE SODIUM SUCC 125 MG IJ SOLR
100.0000 mg | Freq: Once | INTRAMUSCULAR | Status: AC
  Administered 2020-11-04: 100 mg via INTRAVENOUS
  Filled 2020-11-04: qty 2

## 2020-11-04 MED ORDER — SODIUM CHLORIDE 0.9 % IV SOLN
200.0000 mg | Freq: Once | INTRAVENOUS | Status: AC
  Administered 2020-11-04: 200 mg via INTRAVENOUS
  Filled 2020-11-04: qty 40

## 2020-11-04 MED ORDER — ONDANSETRON HCL 4 MG/2ML IJ SOLN: 4.0000 mg | Freq: Four times a day (QID) | INTRAMUSCULAR | Status: DC | PRN

## 2020-11-04 MED ORDER — GUAIFENESIN-DM 100-10 MG/5ML PO SYRP
10.0000 mL | ORAL_SOLUTION | ORAL | Status: DC | PRN
  Filled 2020-11-04: qty 10

## 2020-11-04 MED ORDER — SODIUM CHLORIDE 0.9 % IV SOLN
100.0000 mg | Freq: Every day | INTRAVENOUS | Status: AC
  Administered 2020-11-05 – 2020-11-08 (×4): 100 mg via INTRAVENOUS
  Filled 2020-11-04 (×4): qty 20

## 2020-11-04 MED ORDER — ACETAMINOPHEN 325 MG PO TABS: 650.0000 mg | ORAL_TABLET | Freq: Four times a day (QID) | ORAL | Status: DC | PRN

## 2020-11-04 MED ORDER — ENOXAPARIN SODIUM 40 MG/0.4ML ~~LOC~~ SOLN
40.0000 mg | Freq: Every day | SUBCUTANEOUS | Status: DC
  Administered 2020-11-05: 40 mg via SUBCUTANEOUS
  Filled 2020-11-04: qty 0.4

## 2020-11-04 MED ORDER — PREDNISONE 20 MG PO TABS: 50.0000 mg | ORAL_TABLET | Freq: Every day | ORAL | Status: DC

## 2020-11-04 NOTE — ED Triage Notes (Signed)
BIB EMS for SOB. Pt tested +COVID on the 5th, today is 10th day with symptoms. Increasingly getting short of breathe. 76% on room air, 44 RR. Now 92% ib 4L.

## 2020-11-04 NOTE — ED Provider Notes (Signed)
[facility] EMERGENCY DEPARTMENT Provider Note   CSN: [id number] Arrival date & time: 11/04/20  1836     History Chief Complaint  Patient presents with  . Covid Positive  . Shortness of Breath  . Weakness    Benjamin Munoz is a 59 y.o. male.  The history is provided by the patient and medical records. No language interpreter was used.  Shortness of Breath Severity:  Severe Onset quality:  Gradual Duration:  10 days Timing:  Constant Progression:  Worsening Chronicity:  New Context: URI (covbid)   Relieved by:  Oxygen Worsened by:  Nothing Ineffective treatments:  None tried Associated symptoms: cough and fever   Associated symptoms: no abdominal pain, no chest pain, no diaphoresis, no headaches, no sputum production, no vomiting and no wheezing   Risk factors: no hx of PE/DVT        No past medical history on file.  There are no problems to display for this patient.   No past surgical history on file.     No family history on file.  Social History   Tobacco Use  . Smoking status: Never Smoker  . Smokeless tobacco: Never Used  Substance Use Topics  . Alcohol use: No  . Drug use: No    Home Medications Prior to Admission medications   Medication Sig Start Date End Date Taking? Authorizing Provider  benzonatate (TESSALON) 100 MG capsule Take 1-2 capsules (100-200 mg total) by mouth 3 (three) times daily as needed. 02/07/19   Wallis Bamberg, PA-C  oseltamivir (TAMIFLU) 75 MG capsule Take 1 capsule (75 mg total) by mouth 2 (two) times daily. 02/07/19   Wallis Bamberg, PA-C    Allergies    Patient has no known allergies.  Review of Systems   Review of Systems  Constitutional: Positive for chills, fatigue and fever. Negative for diaphoresis.  HENT: Negative for congestion.   Eyes: Negative for visual disturbance.  Respiratory: Positive for cough, chest tightness and shortness of breath. Negative for sputum production, wheezing and  stridor.   Cardiovascular: Negative for chest pain, palpitations and leg swelling.  Gastrointestinal: Negative for abdominal pain, constipation, diarrhea, nausea and vomiting.  Genitourinary: Negative for dysuria.  Musculoskeletal: Negative for back pain.  Neurological: Negative for dizziness, light-headedness and headaches.  Psychiatric/Behavioral: Negative for agitation.  All other systems reviewed and are negative.   Physical Exam Updated Vital Signs BP (!) 147/84 (BP Location: Right Arm)   Pulse (!) 104   Temp 99.4 F (37.4 C) (Oral)   Resp (!) 40   SpO2 92%   Physical Exam Vitals and nursing note reviewed.  Constitutional:      General: He is not in acute distress.    Appearance: He is well-developed. He is ill-appearing. He is not toxic-appearing or diaphoretic.  HENT:     Head: Normocephalic and atraumatic.     Mouth/Throat:     Mouth: Mucous membranes are moist.  Eyes:     Conjunctiva/sclera: Conjunctivae normal.     Pupils: Pupils are equal, round, and reactive to light.  Cardiovascular:     Rate and Rhythm: Regular rhythm. Tachycardia present.     Pulses: Normal pulses.     Heart sounds: No murmur heard.   Pulmonary:     Effort: Pulmonary effort is normal. Tachypnea present. No respiratory distress.     Breath sounds: Rhonchi present.  Chest:     Chest wall: No tenderness.  Abdominal:     Palpations: Abdomen is  soft.     Tenderness: There is no abdominal tenderness.  Musculoskeletal:     Cervical back: Neck supple.     Right lower leg: No tenderness. No edema.     Left lower leg: No tenderness. No edema.  Skin:    General: Skin is warm and dry.     Capillary Refill: Capillary refill takes less than 2 seconds.  Neurological:     General: No focal deficit present.     Mental Status: He is alert.     ED Results / Procedures / Treatments   Labs (all labs ordered are listed, but only abnormal results are displayed) Labs Reviewed  CBC WITH  DIFFERENTIAL/PLATELET - Abnormal; Notable for the following components:      Result Value   WBC 10.6 (*)    Neutro Abs 7.9 (*)    Abs Immature Granulocytes 0.09 (*)    All other components within normal limits  COMPREHENSIVE METABOLIC PANEL - Abnormal; Notable for the following components:   Sodium 133 (*)    CO2 20 (*)    Glucose, Bld 125 (*)    Calcium 8.2 (*)    Albumin 3.0 (*)    AST 123 (*)    ALT 84 (*)    All other components within normal limits  LACTATE DEHYDROGENASE - Abnormal; Notable for the following components:   LDH 560 (*)    All other components within normal limits  FERRITIN - Abnormal; Notable for the following components:   Ferritin 2,012 (*)    All other components within normal limits  C-REACTIVE PROTEIN - Abnormal; Notable for the following components:   CRP 16.4 (*)    All other components within normal limits  D-DIMER, QUANTITATIVE (NOT AT [facility]) - Abnormal; Notable for the following components:   D-Dimer, Quant 1.73 (*)    All other components within normal limits  FIBRINOGEN - Abnormal; Notable for the following components:   Fibrinogen 648 (*)    All other components within normal limits  CULTURE, BLOOD (ROUTINE X 2)  CULTURE, BLOOD (ROUTINE X 2)  LACTIC ACID, PLASMA  LACTIC ACID, PLASMA  TRIGLYCERIDES  BRAIN NATRIURETIC PEPTIDE  PROCALCITONIN    EKG EKG Interpretation  Date/Time:  Wednesday November 04 2020 18:48:53 EST Ventricular Rate:  101 PR Interval:    QRS Duration: 91 QT Interval:  346 QTC Calculation: 449 R Axis:   14 Text Interpretation: Sinus tachycardia LAE, consider biatrial enlargement Left ventricular hypertrophy No prio rECG for comparison. No STEMI Confirmed by Theda Belfast ([id number]) on 11/04/2020 7:44:24 PM   Radiology DG Chest Port 1 View  Result Date: 11/04/2020 CLINICAL DATA:  60 year old male positive COVID-19.  Hypoxia. EXAM: PORTABLE CHEST 1 VIEW COMPARISON:  Portable chest 11/01/2020. FINDINGS: Portable AP semi  upright view at 2016 hours. Mildly lower lung volumes and moderately progressed Patchy and confluent bilateral mid and lower lung opacity. Opacity is somewhat more extensive on the right now including some right upper lung involvement. The left upper lung remains spared. No pneumothorax. No pleural effusion is evident. Stable cardiac size and mediastinal contours. Paucity of bowel gas in the upper abdomen No acute osseous abnormality identified. Visualized tracheal air column is within normal limits. IMPRESSION: Progressed bilateral COVID-19 pneumonia. Electronically Signed   By: Odessa Fleming M.D.   On: 11/04/2020 20:34    Procedures Procedures (including critical care time)  CRITICAL CARE Performed by: Canary Brim Bassy Fetterly Total critical care time: 30 minutes Critical care time was exclusive of separately  billable procedures and treating other patients. Critical care was necessary to treat or prevent imminent or life-threatening deterioration. Critical care was time spent personally by me on the following activities: development of treatment plan with patient and/or surrogate as well as nursing, discussions with consultants, evaluation of patient's response to treatment, examination of patient, obtaining history from patient or surrogate, ordering and performing treatments and interventions, ordering and review of laboratory studies, ordering and review of radiographic studies, pulse oximetry and re-evaluation of patient's condition.  Benjamin Munoz was evaluated in Emergency Department on 11/04/2020 for the symptoms described in the history of present illness. He was evaluated in the context of the global COVID-19 pandemic, which necessitated consideration that the patient might be at risk for infection with the SARS-CoV-2 virus that causes COVID-19. Institutional protocols and algorithms that pertain to the evaluation of patients at risk for COVID-19 are in a state of rapid change based on information  released by regulatory bodies including the CDC and federal and state organizations. These policies and algorithms were followed during the patient's care in the ED.  Medications Ordered in ED Medications  remdesivir 200 mg in sodium chloride 0.9% 250 mL IVPB (200 mg Intravenous New Bag/Given 11/04/20 2329)    Followed by  remdesivir 100 mg in sodium chloride 0.9 % 100 mL IVPB (has no administration in time range)  methylPREDNISolone sodium succinate (SOLU-MEDROL) 125 mg/2 mL injection 100 mg (100 mg Intravenous Given 11/04/20 2159)    ED Course  I have reviewed the triage vital signs and the nursing notes.  Pertinent labs & imaging results that were available during my care of the patient were reviewed by me and considered in my medical decision making (see chart for details).    MDM Rules/Calculators/A&P                          Benjamin Munoz is a 59 y.o. male with recent diagnosis of COVID-19 who presents with hypoxia and shortness of breath.  According to patient, he was diagnosed with COVID-19 on the fifth, 3 days ago.  Patient says he has had worsening shortness of breath gradually with no chest pain.  He reports that he does not take oxygen at home.  He was found have oxygen saturation of 76 with EMS on room air and was brought in on nasal cannula oxygen supplementation.  He is currently on 5 L during my evaluation to maintain oxygen saturations.  He denies any nausea, vomiting, urinary changes or GI changes.  He is simply having shortness of breath and dry cough.  Denies hemoptysis.  Denies history of DVT, PE, leg pain, or leg swelling.  Denies other complaints.  He is feeling much better now that he is on oxygen and his oxygen saturations are in the mid 90s.  On my exam, patient does have coarse breath sounds bilaterally.  Chest and abdomen are nontender.  Good pulses in extremities.  Legs are nontender nonedematous.  Patient is now resting comfortably.  Patient will need  admission for COVID-19.  We will get the preadmission COVID-19 order set ordered.  Will call medicine for admission when work-up has been completed for hypoxia in the setting of COVID-19.  Due to hypoxia with COVID-19, will call for admission.  Order set was obtained.  Will defer to admitting team if they want to do a PE study as the D-dimer was more for COVID-19 trending with the order set.  Patient  clinically was feeling better on oxygen now.  Patient admitted to medicine service for Covid.  Final Clinical Impression(s) / ED Diagnoses Final diagnoses:  COVID    Clinical Impression: 1. COVID     Disposition: Admit  This note was prepared with assistance of Dragon voice recognition software. Occasional wrong-word or sound-a-like substitutions may have occurred due to the inherent limitations of voice recognition software.     Dareen Gutzwiller, Canary Brimhristopher J, MD 11/04/20 [phone]

## 2020-11-05 ENCOUNTER — Other Ambulatory Visit: Payer: Self-pay

## 2020-11-05 ENCOUNTER — Inpatient Hospital Stay (HOSPITAL_COMMUNITY): Payer: BLUE CROSS/BLUE SHIELD

## 2020-11-05 ENCOUNTER — Encounter (HOSPITAL_COMMUNITY): Payer: Self-pay | Admitting: Internal Medicine

## 2020-11-05 DIAGNOSIS — E041 Nontoxic single thyroid nodule: Secondary | ICD-10-CM | POA: Diagnosis present

## 2020-11-05 DIAGNOSIS — U071 COVID-19: Principal | ICD-10-CM

## 2020-11-05 DIAGNOSIS — J9601 Acute respiratory failure with hypoxia: Secondary | ICD-10-CM

## 2020-11-05 LAB — CBC WITH DIFFERENTIAL/PLATELET
Abs Immature Granulocytes: 0.09 10*3/uL — ABNORMAL HIGH (ref 0.00–0.07)
Basophils Absolute: 0 10*3/uL (ref 0.0–0.1)
Basophils Relative: 0 %
Eosinophils Absolute: 0 10*3/uL (ref 0.0–0.5)
Eosinophils Relative: 0 %
HCT: 42.7 % (ref 39.0–52.0)
Hemoglobin: 14.3 g/dL (ref 13.0–17.0)
Immature Granulocytes: 1 %
Lymphocytes Relative: 16 %
Lymphs Abs: 1.2 10*3/uL (ref 0.7–4.0)
MCH: 29 pg (ref 26.0–34.0)
MCHC: 33.5 g/dL (ref 30.0–36.0)
MCV: 86.6 fL (ref 80.0–100.0)
Monocytes Absolute: 0.1 10*3/uL (ref 0.1–1.0)
Monocytes Relative: 2 %
Neutro Abs: 6.1 10*3/uL (ref 1.7–7.7)
Neutrophils Relative %: 81 %
Platelets: 234 10*3/uL (ref 150–400)
RBC: 4.93 MIL/uL (ref 4.22–5.81)
RDW: 13.9 % (ref 11.5–15.5)
WBC: 7.6 10*3/uL (ref 4.0–10.5)
nRBC: 0 % (ref 0.0–0.2)

## 2020-11-05 LAB — COMPREHENSIVE METABOLIC PANEL
ALT: 99 U/L — ABNORMAL HIGH (ref 0–44)
AST: 116 U/L — ABNORMAL HIGH (ref 15–41)
Albumin: 2.7 g/dL — ABNORMAL LOW (ref 3.5–5.0)
Alkaline Phosphatase: 44 U/L (ref 38–126)
Anion gap: 15 (ref 5–15)
BUN: 17 mg/dL (ref 6–20)
CO2: 22 mmol/L (ref 22–32)
Calcium: 8.6 mg/dL — ABNORMAL LOW (ref 8.9–10.3)
Chloride: 101 mmol/L (ref 98–111)
Creatinine, Ser: 1.22 mg/dL (ref 0.61–1.24)
GFR, Estimated: >60 mL/min (ref >60)
Glucose, Bld: 193 mg/dL — ABNORMAL HIGH (ref 70–99)
Potassium: 4.1 mmol/L (ref 3.5–5.1)
Sodium: 138 mmol/L (ref 135–145)
Total Bilirubin: 0.7 mg/dL (ref 0.3–1.2)
Total Protein: 7.2 g/dL (ref 6.5–8.1)

## 2020-11-05 LAB — D-DIMER, QUANTITATIVE: D-Dimer, Quant: 1.54 ug/mL-FEU — ABNORMAL HIGH (ref 0.00–0.50)

## 2020-11-05 LAB — PROCALCITONIN: Procalcitonin: 0.46 ng/mL

## 2020-11-05 LAB — HIV ANTIBODY (ROUTINE TESTING W REFLEX): HIV Screen 4th Generation wRfx: NONREACTIVE

## 2020-11-05 LAB — CBG MONITORING, ED: Glucose-Capillary: 168 mg/dL — ABNORMAL HIGH (ref 70–99)

## 2020-11-05 LAB — C-REACTIVE PROTEIN: CRP: 19 mg/dL — ABNORMAL HIGH (ref <1.0)

## 2020-11-05 MED ORDER — INSULIN ASPART 100 UNIT/ML ~~LOC~~ SOLN
0.0000 [IU] | Freq: Three times a day (TID) | SUBCUTANEOUS | Status: DC
  Administered 2020-11-05: 2 [IU] via SUBCUTANEOUS
  Administered 2020-11-06: 5 [IU] via SUBCUTANEOUS
  Administered 2020-11-06 – 2020-11-07 (×3): 3 [IU] via SUBCUTANEOUS

## 2020-11-05 MED ORDER — INFLUENZA VAC SPLIT QUAD 0.5 ML IM SUSY: 0.5000 mL | PREFILLED_SYRINGE | INTRAMUSCULAR | Status: DC

## 2020-11-05 MED ORDER — FUROSEMIDE 10 MG/ML IJ SOLN
40.0000 mg | Freq: Once | INTRAMUSCULAR | Status: AC
  Administered 2020-11-05: 40 mg via INTRAVENOUS
  Filled 2020-11-05: qty 4

## 2020-11-05 MED ORDER — ALBUTEROL SULFATE HFA 108 (90 BASE) MCG/ACT IN AERS
2.0000 | INHALATION_SPRAY | RESPIRATORY_TRACT | Status: DC | PRN
  Filled 2020-11-05: qty 6.7

## 2020-11-05 MED ORDER — SODIUM CHLORIDE 0.9 % IV SOLN
2.0000 g | INTRAVENOUS | Status: DC
  Administered 2020-11-05: 2 g via INTRAVENOUS
  Filled 2020-11-05: qty 20

## 2020-11-05 MED ORDER — IOHEXOL 350 MG/ML SOLN
100.0000 mL | Freq: Once | INTRAVENOUS | Status: AC | PRN
  Administered 2020-11-05: 100 mL via INTRAVENOUS

## 2020-11-05 MED ORDER — BARICITINIB 2 MG PO TABS
4.0000 mg | ORAL_TABLET | Freq: Every day | ORAL | Status: DC
  Administered 2020-11-05 – 2020-11-13 (×9): 4 mg via ORAL
  Filled 2020-11-05 (×9): qty 2

## 2020-11-05 MED ORDER — BENZONATATE 100 MG PO CAPS
200.0000 mg | ORAL_CAPSULE | Freq: Three times a day (TID) | ORAL | Status: DC
  Administered 2020-11-05 – 2020-11-13 (×23): 200 mg via ORAL
  Filled 2020-11-05 (×23): qty 2

## 2020-11-05 MED ORDER — CLONAZEPAM 0.25 MG PO TBDP: 0.2500 mg | ORAL_TABLET | Freq: Three times a day (TID) | ORAL | Status: DC | PRN

## 2020-11-05 MED ORDER — CLONAZEPAM 0.5 MG PO TABS: 0.2500 mg | ORAL_TABLET | Freq: Three times a day (TID) | ORAL | Status: DC | PRN

## 2020-11-05 MED ORDER — PANTOPRAZOLE SODIUM 40 MG PO TBEC
40.0000 mg | DELAYED_RELEASE_TABLET | Freq: Every day | ORAL | Status: DC
  Administered 2020-11-05 – 2020-11-13 (×9): 40 mg via ORAL
  Filled 2020-11-05 (×9): qty 1

## 2020-11-05 MED ORDER — ENOXAPARIN SODIUM 40 MG/0.4ML ~~LOC~~ SOLN
40.0000 mg | Freq: Two times a day (BID) | SUBCUTANEOUS | Status: DC
  Administered 2020-11-05 – 2020-11-09 (×8): 40 mg via SUBCUTANEOUS
  Filled 2020-11-05 (×8): qty 0.4

## 2020-11-05 MED ORDER — BARICITINIB 2 MG PO TABS
4.0000 mg | ORAL_TABLET | Freq: Every day | ORAL | Status: DC
  Filled 2020-11-05: qty 2

## 2020-11-05 MED ORDER — SODIUM CHLORIDE 0.9 % IV SOLN
500.0000 mg | INTRAVENOUS | Status: DC
  Administered 2020-11-05: 500 mg via INTRAVENOUS
  Filled 2020-11-05: qty 500

## 2020-11-05 NOTE — Progress Notes (Signed)
PROGRESS NOTE                                                                                                                                                                                                             Patient Demographics:    Benjamin Munoz, is a 59 y.o. male, DOB - 25-Apr-1961, XBJ:[id number]  Outpatient Primary MD for the patient is Pcp, No   Admit date - 11/04/2020   LOS - 1  Chief Complaint  Patient presents with  . Covid Positive  . Shortness of Breath  . Weakness       Brief Narrative: Patient is a 59 y.o. male with no significant PMHx-tested positive for COVID-19 on 12/5-presented to the ED with approximately 5-6-day history of worsening shortness of breath-found of acute hypoxic respiratory failure due to COVID-19 pneumonia.  COVID-19 vaccinated status: Unvaccinated  Significant Events: 12/8>> Admit to [facility] for severe hypoxemia due to COVID-19 pneumonia  Significant studies: 12/9>> CTA chest: No PE, Covid pattern pneumonia.  COVID-19 medications: Steroids: 12/8>> Remdesivir: 12/8>> Baricitinib: 12/8>>  Antibiotics: Rocephin: 12/8 x 1 Zithromax: 12/8 x 1  Microbiology data: 12/8 >>blood culture: Negative  Procedures: None  Consults: None  DVT prophylaxis: enoxaparin (LOVENOX) injection 40 mg Start: 11/05/20 0015     Subjective:    Benjamin Munoz today feels short of breath with minimal activity-appears stable at rest.  On 13-40 L of HFNC this morning.   Assessment  & Plan :   Acute Hypoxic Resp Failure due to Covid 19 Viral pneumonia: Has a severe hypoxemia-requiring around 13-14 L of HFNC-but appears stable at rest-and not in any respiratory distress.  Plans are to continue with steroids/Remdesivir and baricitinib.  Doubt he has any bacterial pneumonia-do not think he requires IV antibiotics-we will go and stop Rocephin/Zithromax today.  He does not have any evidence  of fluid overload-does not require diuretics.  Watch closely-if hypoxemia worsens significantly-he may require transfer to the ICU  Note-patient does not have a history of hepatitis B, tuberculosis, diverticulitis-and consents to the continued use of baricitinib.  Fever: afebrile O2 requirements:  SpO2: 95 % O2 Flow Rate (L/min): 13 L/min   COVID-19 Labs: Recent Labs    11/04/20 2050 11/04/20 2203 11/05/20 0640  DDIMER  --  1.73* 1.54*  FERRITIN 2,012*  --   --   LDH  560*  --   --   CRP 16.4*  --  19.0*       Component Value Date/Time   BNP 44.4 11/04/2020 2050    Recent Labs  Lab 11/04/20 2050  PROCALCITON 0.46    Lab Results  Component Value Date   SARSCOV2NAA POSITIVE (A) 11/01/2020     Prone/Incentive Spirometry: encouraged patient to lie prone for 3-4 hours at a time for a total of 16 hours a day, and to encourage incentive spirometry use 3-4/hour.  Transaminitis: Secondary to COVID-19-follow.  Steroid-induced hyperglycemia: Start SSI-check A1c with a.m. labs.  No results for input(s): GLUCAP in the last 72 hours.  2 cm left thyroid nodule: Seen incidentally on CT chest-stable for outpatient follow-up with PCP.  Check TSH with a.m. labs.  Obesity Body mass index is 31.03 kg/m.  GI prophylaxis: PPI  ABG: No results found for: PHART, PCO2ART, PO2ART, HCO3, TCO2, ACIDBASEDEF, O2SAT  Vent Settings: N/A  Condition - Extremely Guarded  Family Communication  :  Spouse Frederik Schmidt [phone]) updated over the phone 12/9  Code Status :  Full Code  Diet :  Diet Order            Diet Heart Room service appropriate? Yes; Fluid consistency: Thin  Diet effective now                  Disposition Plan  :   Status is: Inpatient  Remains inpatient appropriate because:Inpatient level of care appropriate due to severity of illness   Dispo: The patient is from: Home              Anticipated d/c is to: Home              Anticipated d/c date is: > 3  days              Patient currently is not medically stable to d/c.    Barriers to discharge: Hypoxia requiring O2 supplementation/complete 5 days of IV Remdesivir  Antimicorbials  :    Anti-infectives (From admission, onward)   Start     Dose/Rate Route Frequency Ordered Stop   11/05/20 1000  remdesivir 100 mg in sodium chloride 0.9 % 100 mL IVPB       "Followed by" Linked Group Details   100 mg 200 mL/hr over 30 Minutes Intravenous Daily 11/04/20 2131 11/09/20 0959   11/05/20 0100  cefTRIAXone (ROCEPHIN) 2 g in sodium chloride 0.9 % 100 mL IVPB        2 g 200 mL/hr over 30 Minutes Intravenous Every 24 hours 11/05/20 0048     11/05/20 0100  azithromycin (ZITHROMAX) 500 mg in sodium chloride 0.9 % 250 mL IVPB        500 mg 250 mL/hr over 60 Minutes Intravenous Every 24 hours 11/05/20 0048     11/04/20 2200  remdesivir 200 mg in sodium chloride 0.9% 250 mL IVPB       "Followed by" Linked Group Details   200 mg 580 mL/hr over 30 Minutes Intravenous Once 11/04/20 2131 11/05/20 0049      Inpatient Medications  Scheduled Meds: . baricitinib  4 mg Oral Daily  . enoxaparin (LOVENOX) injection  40 mg Subcutaneous QHS  . methylPREDNISolone (SOLU-MEDROL) injection  1 mg/kg Intravenous Q12H   Followed by  . [START ON 11/08/2020] predniSONE  50 mg Oral Daily   Continuous Infusions: . azithromycin Stopped (11/05/20 0545)  . cefTRIAXone (ROCEPHIN)  IV Stopped (11/05/20 0330)  . remdesivir  100 mg in NS 100 mL Stopped (11/05/20 1200)   PRN Meds:.acetaminophen, chlorpheniramine-HYDROcodone, guaiFENesin-dextromethorphan, ondansetron **OR** ondansetron (ZOFRAN) IV   Time Spent in minutes  35    See all Orders from today for further details   Jeoffrey Massed M.D on 11/05/2020 at 12:11 PM  To page go to [email or web address] - use universal password  [facility] -  Office  [phone]    Objective:   Vitals:   11/05/20 1000 11/05/20 1100 11/05/20 1151 11/05/20 1200  BP: 128/64  (!) 149/103 (!) 149/103 113/67  Pulse: 91 (!) 104 (!) 101 93  Resp: (!) 38 (!) 51 (!) 40 (!) 44  Temp: (!) 97.5 F (36.4 C)     TempSrc: Axillary     SpO2: 95% 93% 98% 95%  Weight:      Height:        Wt Readings from Last 3 Encounters:  11/04/20 95.3 kg  11/01/20 95.3 kg  01/09/18 97.5 kg     Intake/Output Summary (Last 24 hours) at 11/05/2020 1211 Last data filed at 11/05/2020 1200 Gross per 24 hour  Intake 651.28 ml  Output 400 ml  Net 251.28 ml     Physical Exam Gen Exam:Alert awake-not in any distress HEENT:atraumatic, normocephalic Chest: B/L clear to auscultation anteriorly CVS:S1S2 regular Abdomen:soft non tender, non distended Extremities:no edema Neurology: Non focal Skin: no rash   Data Review:    CBC Recent Labs  Lab 11/01/20 1002 11/04/20 2050 11/05/20 0640  WBC 7.2 10.6* 7.6  HGB 15.1 14.8 14.3  HCT 45.6 45.8 42.7  PLT 155 221 234  MCV 87.4 87.4 86.6  MCH 28.9 28.2 29.0  MCHC 33.1 32.3 33.5  RDW 13.5 13.7 13.9  LYMPHSABS 2.2 2.0 1.2  MONOABS 0.3 0.6 0.1  EOSABS 0.0 0.0 0.0  BASOSABS 0.0 0.0 0.0    Chemistries  Recent Labs  Lab 11/01/20 1002 11/04/20 2050 11/05/20 0640  NA 133* 133* 138  K 4.0 4.5 4.1  CL 99 100 101  CO2 23 20* 22  GLUCOSE 125* 125* 193*  BUN 9 14 17   CREATININE 1.10 1.20 1.22  CALCIUM 8.6* 8.2* 8.6*  AST 41 123* 116*  ALT 33 84* 99*  ALKPHOS 46 47 44  BILITOT 0.7 0.8 0.7   ------------------------------------------------------------------------------------------------------------------ Recent Labs    11/04/20 2050  TRIG 131    No results found for: HGBA1C ------------------------------------------------------------------------------------------------------------------ No results for input(s): TSH, T4TOTAL, T3FREE, THYROIDAB in the last 72 hours.  Invalid input(s): FREET3 ------------------------------------------------------------------------------------------------------------------ Recent Labs     11/04/20 2050  FERRITIN 2,012*    Coagulation profile No results for input(s): INR, PROTIME in the last 168 hours.  Recent Labs    11/04/20 2203 11/05/20 0640  DDIMER 1.73* 1.54*    Cardiac Enzymes No results for input(s): CKMB, TROPONINI, MYOGLOBIN in the last 168 hours.  Invalid input(s): CK ------------------------------------------------------------------------------------------------------------------    Component Value Date/Time   BNP 44.4 11/04/2020 2050    Micro Results Recent Results (from the past 240 hour(s))  Resp Panel by RT-PCR (Flu A&B, Covid) Nasopharyngeal Swab     Status: Abnormal   Collection Time: 11/01/20 10:02 AM   Specimen: Nasopharyngeal Swab; Nasopharyngeal(NP) swabs in vial transport medium  Result Value Ref Range Status   SARS Coronavirus 2 by RT PCR POSITIVE (A) NEGATIVE Final    Comment: RESULT CALLED TO, READ BACK BY AND VERIFIED WITH: E14/05/21 RN, ST 1124 11/01/20 D. VANHOOK (NOTE) SARS-CoV-2 target nucleic acids are DETECTED.  The SARS-CoV-2 RNA is  generally detectable in upper respiratory specimens during the acute phase of infection. Positive results are indicative of the presence of the identified virus, but do not rule out bacterial infection or co-infection with other pathogens not detected by the test. Clinical correlation with patient history and other diagnostic information is necessary to determine patient infection status. The expected result is Negative.  Fact Sheet for Patients: BloggerCourse.com  Fact Sheet for Healthcare Providers: SeriousBroker.it  This test is not yet approved or cleared by the [place] FDA and  has been authorized for detection and/or diagnosis of SARS-CoV-2 by FDA under an Emergency Use Authorization (EUA).  This EUA will remain in effect (meaning this tes t can be used) for the duration of  the COVID-19 declaration under Section  564(b)(1) of the Act, 21 U.S.C. section 360bbb-3(b)(1), unless the authorization is terminated or revoked sooner.     Influenza A by PCR NEGATIVE NEGATIVE Final   Influenza B by PCR NEGATIVE NEGATIVE Final    Comment: (NOTE) The Xpert Xpress SARS-CoV-2/FLU/RSV plus assay is intended as an aid in the diagnosis of influenza from Nasopharyngeal swab specimens and should not be used as a sole basis for treatment. Nasal washings and aspirates are unacceptable for Xpert Xpress SARS-CoV-2/FLU/RSV testing.  Fact Sheet for Patients: BloggerCourse.com  Fact Sheet for Healthcare Providers: SeriousBroker.it  This test is not yet approved or cleared by the [place] FDA and has been authorized for detection and/or diagnosis of SARS-CoV-2 by FDA under an Emergency Use Authorization (EUA). This EUA will remain in effect (meaning this test can be used) for the duration of the COVID-19 declaration under Section 564(b)(1) of the Act, 21 U.S.C. section 360bbb-3(b)(1), unless the authorization is terminated or revoked.  Performed at [facility], [address] [place], [place] [address]   Blood Culture (routine x 2)     Status: None (Preliminary result)   Collection Time: 11/04/20  8:52 PM   Specimen: BLOOD  Result Value Ref Range Status   Specimen Description BLOOD LEFT ANTECUBITAL  Final   Special Requests   Final    BOTTLES DRAWN AEROBIC AND ANAEROBIC Blood Culture results may not be optimal due to an inadequate volume of blood received in culture bottles   Culture   Final    NO GROWTH < 12 HOURS Performed at [facility], [address], [place], [place] [address]    Report Status PENDING  Incomplete  Blood Culture (routine x 2)     Status: None (Preliminary result)   Collection Time: 11/04/20  8:55 PM   Specimen: BLOOD  Result Value Ref Range Status   Specimen Description BLOOD RIGHT ANTECUBITAL  Final   Special  Requests   Final    BOTTLES DRAWN AEROBIC AND ANAEROBIC Blood Culture adequate volume   Culture   Final    NO GROWTH < 12 HOURS Performed at [facility], [address], [place], [place] [address]    Report Status PENDING  Incomplete    Radiology Reports CT Angio Chest PE W and/or Wo Contrast  Result Date: 11/05/2020 CLINICAL DATA:  COVID positive.  Shortness of breath EXAM: CT ANGIOGRAPHY CHEST WITH CONTRAST TECHNIQUE: Multidetector CT imaging of the chest was performed using the standard protocol during bolus administration of intravenous contrast. Multiplanar CT image reconstructions and MIPs were obtained to evaluate the vascular anatomy. CONTRAST:  OMNIPAQUE IOHEXOL 350 MG/ML SOLN COMPARISON:  Chest x-ray from yesterday and 4 days ago FINDINGS: Cardiovascular:  Satisfactory opacification of the pulmonary arteries to the segmental level. No evidence of pulmonary embolism when allowing for levels of motion artifact. Normal heart size. No pericardial effusion. Mediastinum/Nodes: 2 cm left thyroid nodule. No adenopathy in the mediastinum. Lungs/Pleura: Patchy ground-glass opacity throughout all lobes with intervening normal lung. Lung volumes are low. No visible effusion or pneumothorax. Upper Abdomen: There could be hepatic steatosis, but certainty is limited by arterial timing. Musculoskeletal: Spondylosis. Review of the MIP images confirms the above findings. IMPRESSION: 1. COVID pattern pneumonia. 2. Motion degraded chest CTA with no evidence of pulmonary embolism. 3. 2 cm left thyroid nodule. Recommend thyroid US.(Ref: J Am Coll Radiol. 2015 Feb;12(2): 143-50). Electronically Signed   By: Marnee SpringJonathon  Watts M.D.   On: 11/05/2020 05:09   DG Chest Port 1 View  Result Date: 11/04/2020 CLINICAL DATA:  59 year old male positive COVID-19.  Hypoxia. EXAM: PORTABLE CHEST 1 VIEW COMPARISON:  Portable chest 11/01/2020. FINDINGS: Portable AP semi upright view at 2016 hours. Mildly lower lung  volumes and moderately progressed Patchy and confluent bilateral mid and lower lung opacity. Opacity is somewhat more extensive on the right now including some right upper lung involvement. The left upper lung remains spared. No pneumothorax. No pleural effusion is evident. Stable cardiac size and mediastinal contours. Paucity of bowel gas in the upper abdomen No acute osseous abnormality identified. Visualized tracheal air column is within normal limits. IMPRESSION: Progressed bilateral COVID-19 pneumonia. Electronically Signed   By: Odessa FlemingH  Hall M.D.   On: 11/04/2020 20:34   DG Chest Portable 1 View  Result Date: 11/01/2020 CLINICAL DATA:  Fever, chills, body aches. EXAM: PORTABLE CHEST 1 VIEW COMPARISON:  None. FINDINGS: Borderline cardiomegaly. Subtle patchy ground-glass opacities bilaterally. No pleural effusion or pneumothorax is seen. Osseous structures about the chest are unremarkable. IMPRESSION: 1. Subtle patchy ground-glass opacities bilaterally, suspicious for multifocal pneumonia. 2. Borderline cardiomegaly. Electronically Signed   By: Bary RichardStan  Maynard M.D.   On: 11/01/2020 10:33

## 2020-11-05 NOTE — H&P (Addendum)
History and Physical    Benjamin Munoz SKA:[id number] DOB: 07-06-1961 DOA: 11/04/2020  PCP: Pcp, No  Patient coming from: Home  I have personally briefly reviewed patient's old medical records in [facility]  Chief Complaint: SOB  HPI: Benjamin Munoz is a 59 y.o. male with medical history significant of no chronic medical problems.  Pt didn't get COVID vaccine.  Pt began to feel ill with URI like symptoms ~Sunday 28th.  Symptoms progressively worsened.  Seen in ED on 12/6.  Diagnosed with COVID.  Called for possible COVID MAB later that day but no answer.  Returns to ED today with severe worsening of symptoms, SOB, fever, chills, cough.  No CP, abd pain, N/V.   ED Course: CXR confirms worsening COVID.  Desatting to 70s on RA, now requiring 6L [place] to maintain sat of 93%.  RR in the 40s.   Review of Systems: As per HPI, otherwise all review of systems negative.  No past medical history on file.  No past surgical history on file.   reports that he has never smoked. He has never used smokeless tobacco. He reports that he does not drink alcohol and does not use drugs.  No Known Allergies  No family history on file.   Prior to Admission medications   Medication Sig Start Date End Date Taking? Authorizing Provider  acetaminophen (TYLENOL) 325 MG tablet Take 650 mg by mouth every 6 (six) hours as needed for mild pain, fever or headache.   Yes [provider]    Physical Exam: Vitals:   11/04/20 2226 11/04/20 2230 11/04/20 2300 11/04/20 2342  BP:  138/85 (!) 150/88 (!) 151/92  Pulse:  96 (!) 107 98  Resp:  (!) 43 (!) 24 (!) 37  Temp:    98.7 F (37.1 C)  TempSrc:    Oral  SpO2:  93% 90% 94%  Weight: 95.3 kg     Height: 5\' 9"  (1.753 m)       Constitutional: NAD, calm, comfortable Eyes: PERRL, lids and conjunctivae normal ENMT: Mucous membranes are moist. Posterior pharynx clear of any exudate or lesions.Normal dentition.  Neck: normal, supple,  no masses, no thyromegaly Respiratory: Severe tachypnea Cardiovascular: Regular rate and rhythm, no murmurs / rubs / gallops. No extremity edema. 2+ pedal pulses. No carotid bruits.  Abdomen: no tenderness, no masses palpated. No hepatosplenomegaly. Bowel sounds positive.  Musculoskeletal: no clubbing / cyanosis. No joint deformity upper and lower extremities. Good ROM, no contractures. Normal muscle tone.  Skin: no rashes, lesions, ulcers. No induration Neurologic: CN 2-12 grossly intact. Sensation intact, DTR normal. Strength 5/5 in all 4.  Psychiatric: Normal judgment and insight. Alert and oriented x 3. Normal mood.    Labs on Admission: I have personally reviewed following labs and imaging studies  CBC: Recent Labs  Lab 11/01/20 1002 11/04/20 2050  WBC 7.2 10.6*  NEUTROABS 4.6 7.9*  HGB 15.1 14.8  HCT 45.6 45.8  MCV 87.4 87.4  PLT 155 221   Basic Metabolic Panel: Recent Labs  Lab 11/01/20 1002 11/04/20 2050  NA 133* 133*  K 4.0 4.5  CL 99 100  CO2 23 20*  GLUCOSE 125* 125*  BUN 9 14  CREATININE 1.10 1.20  CALCIUM 8.6* 8.2*   GFR: Estimated Creatinine Clearance: 75.5 mL/min (by C-G formula based on SCr of 1.2 mg/dL). Liver Function Tests: Recent Labs  Lab 11/01/20 1002 11/04/20 2050  AST 41 123*  ALT 33 84*  ALKPHOS 46 47  BILITOT 0.7 0.8  PROT 7.5 7.1  ALBUMIN 3.5 3.0*   No results for input(s): LIPASE, AMYLASE in the last 168 hours. No results for input(s): AMMONIA in the last 168 hours. Coagulation Profile: No results for input(s): INR, PROTIME in the last 168 hours. Cardiac Enzymes: No results for input(s): CKTOTAL, CKMB, CKMBINDEX, TROPONINI in the last 168 hours. BNP (last 3 results) No results for input(s): PROBNP in the last 8760 hours. HbA1C: No results for input(s): HGBA1C in the last 72 hours. CBG: No results for input(s): GLUCAP in the last 168 hours. Lipid Profile: Recent Labs    11/04/20 2050  TRIG 131   Thyroid Function  Tests: No results for input(s): TSH, T4TOTAL, FREET4, T3FREE, THYROIDAB in the last 72 hours. Anemia Panel: Recent Labs    11/04/20 2050  FERRITIN 2,012*   Urine analysis: No results found for: COLORURINE, APPEARANCEUR, LABSPEC, PHURINE, GLUCOSEU, HGBUR, BILIRUBINUR, KETONESUR, PROTEINUR, UROBILINOGEN, NITRITE, LEUKOCYTESUR  Radiological Exams on Admission: DG Chest Port 1 View  Result Date: 11/04/2020 CLINICAL DATA:  59 year old male positive COVID-19.  Hypoxia. EXAM: PORTABLE CHEST 1 VIEW COMPARISON:  Portable chest 11/01/2020. FINDINGS: Portable AP semi upright view at 2016 hours. Mildly lower lung volumes and moderately progressed Patchy and confluent bilateral mid and lower lung opacity. Opacity is somewhat more extensive on the right now including some right upper lung involvement. The left upper lung remains spared. No pneumothorax. No pleural effusion is evident. Stable cardiac size and mediastinal contours. Paucity of bowel gas in the upper abdomen No acute osseous abnormality identified. Visualized tracheal air column is within normal limits. IMPRESSION: Progressed bilateral COVID-19 pneumonia. Electronically Signed   By: Odessa Fleming M.D.   On: 11/04/2020 20:34    EKG: Independently reviewed.  Assessment/Plan Principal Problem:   Acute hypoxemic respiratory failure due to COVID-19 (HCC)    1. Acute hypoxic resp failure due to COVID-19 - pending ARDS 1. COVID pathway 2. remdesivir 3. Steroids 4. Daily labs 5. WBC 10.6k, procalcitonin pending 0.48. 1. Spoke with Dr. Warrick Parisian: 2. Recd ABx for possible superimposed infection 3. And holding off on barcitinib. 4. Will go ahead and do this, barcitinib not given yet per RN. 6. Tele monitor 7. Cont pulse ox 2. Transaminitis - 1. Probably due to COVID 2. Daily CMP  DVT prophylaxis: Lovenox Code Status: Full Family Communication: No family in room Disposition Plan: Home after admit Consults called: None Admission status: Admit to  inpatient  Severity of Illness: The appropriate patient status for this patient is INPATIENT. Inpatient status is judged to be reasonable and necessary in order to provide the required intensity of service to ensure the patient's safety. The patient's presenting symptoms, physical exam findings, and initial radiographic and laboratory data in the context of their chronic comorbidities is felt to place them at high risk for further clinical deterioration. Furthermore, it is not anticipated that the patient will be medically stable for discharge from the hospital within 2 midnights of admission. The following factors support the patient status of inpatient.   IP status due to worsening COVID-19 with new 6L O2 requirement.   * I certify that at the point of admission it is my clinical judgment that the patient will require inpatient hospital care spanning beyond 2 midnights from the point of admission due to high intensity of service, high risk for further deterioration and high frequency of surveillance required.*    Briani Maul M. DO [facility]  How to contact the [facility] Attending or Consulting  provider 7A - 7P or covering provider during after hours 7P -7A, for this patient?  1. Check the care team in [facility] and look for a) attending/consulting [facility] provider listed and b) the [facility] team listed 2. Log into [email or web address]  Amion Physician Scheduling and messaging for groups and whole hospitals  On call and physician scheduling software for group practices, residents, hospitalists and other medical providers for call, clinic, rotation and shift schedules. OnCall Enterprise is a hospital-wide system for scheduling doctors and paging doctors on call. EasyPlot is for scientific plotting and data analysis.  [email or web address]  and use [place]'s universal password to access. If you do not have the password, please contact the hospital operator.  3. Locate the [facility] provider you are looking for under  [facility] and page to a number that you can be directly reached. 4. If you still have difficulty reaching the provider, please page the [facility] (Director on Call) for the Hospitalists listed on amion for assistance.  11/05/2020, 12:12 AM

## 2020-11-05 NOTE — Progress Notes (Signed)
Re-evaluation: Now on NRB + 15 HFNC-slightly tacypneic-but comfortable. Claims really not that SOB compared to this am.   O/E Moving air well bilaterally-few bibasilar rales  Imp: COVID PNA with worsening hypoxemia  Plan: Will ask RT to place on HHFNC-although more hypoxic-he is comfortable. Watch closely-if hypoxemia worsens or if he develops distress-will transfer to ICU.   Add prn Klonopin for anxiety Lasix x1 to see if will improve hypoxia/keep in neg balance

## 2020-11-05 NOTE — ED Notes (Signed)
Added NRM to Maintain oxygen sats.  MD notified

## 2020-11-05 NOTE — Progress Notes (Signed)
Patient with increasing O2 requirement, Now 8L with RR 42.  Trying to order CTA PE study of chest to r/o PE but epic doesn't seem to be allowing hospitalists to order imaging studies right now.  Trying to have patients RN put it in as a VORB as a work-around.

## 2020-11-05 NOTE — Progress Notes (Addendum)
Due to worsening hypoxia overnight, after discussion with Dr. Jerral Ralph who is taking over care this AM: feeling is that benefits of barcitinib outweigh risks at this point..  Ordering barcitinib.  CTA chest neg for PE: just shows COVID PNA and a thyroid nodule that needs follow up US.

## 2020-11-05 NOTE — Progress Notes (Signed)
RT note. Pt. Transported from ED to 5W rm 9 without any complications.

## 2020-11-05 NOTE — ED Notes (Signed)
Breakfast ordered 

## 2020-11-05 NOTE — TOC Initial Note (Signed)
Transition of Care [facility]) - Initial/Assessment Note    Patient Details  Name: Benjamin Munoz MRN: [id number] Date of Birth: 1961-06-03  Transition of Care [facility]) CM/SW Contact:    Lockie Pares, RN Phone Number: 11/05/2020, 3:55 PM  Clinical Narrative:                 5-6 day history of SHOB unvaccinated positive COVID no PE  Started on IV treatments. And lovonox. On   14 LPM  Oxygen, CM will follow for needs. Lives with spouse at home no DME.   Expected Discharge Plan: Home/Self Care Barriers to Discharge: Continued Medical Work up   Patient Goals and CMS Choice        Expected Discharge Plan and Services Expected Discharge Plan: Home/Self Care   Discharge Planning Services: CM Consult   Living arrangements for the past 2 months: Single Family Home                                      Prior Living Arrangements/Services Living arrangements for the past 2 months: Single Family Home Lives with:: Spouse Patient language and need for interpreter reviewed:: Yes        Need for Family Participation in Patient Care: Yes (Comment) Care giver support system in place?: Yes (comment)   Criminal Activity/Legal Involvement Pertinent to Current Situation/Hospitalization: No - Comment as needed  Activities of Daily Living      Permission Sought/Granted                  Emotional Assessment       Orientation: : Oriented to Self,Oriented to Place,Oriented to  Time,Oriented to Situation Alcohol / Substance Use: Not Applicable    Admission diagnosis:  Acute hypoxemic respiratory failure due to COVID-19 (HCC) [U07.1, J96.01] Acute respiratory failure with hypoxia (HCC) [J96.01] Patient Active Problem List   Diagnosis Date Noted  . Thyroid nodule 11/05/2020  . Acute respiratory failure with hypoxia (HCC) 11/05/2020  . Acute hypoxemic respiratory failure due to COVID-19 (HCC) 11/04/2020   PCP:  Oneita Hurt, No Pharmacy:   [id number] [place], [place] - [phone] AT [place] [facility] & [address] [place] [address] Phone: [phone] Fax: [phone]  [facility] #[id number] [place], [place] - [address] AT [facility] [address] [place] [address] Phone: [phone] Fax: [phone]     Social Determinants of Health (SDOH) Interventions    Readmission Risk Interventions No flowsheet data found.

## 2020-11-05 NOTE — ED Notes (Signed)
Patient up and chest PT done for sats 87-88 on 13 l/min O2 per [place]. Patient is alert, oriented and able to turn himself to prone position with assist. Sats 95% in prone position. Will continue to monitor.

## 2020-11-06 ENCOUNTER — Inpatient Hospital Stay (HOSPITAL_COMMUNITY): Payer: BLUE CROSS/BLUE SHIELD

## 2020-11-06 DIAGNOSIS — E041 Nontoxic single thyroid nodule: Secondary | ICD-10-CM

## 2020-11-06 LAB — COMPREHENSIVE METABOLIC PANEL
ALT: 117 U/L — ABNORMAL HIGH (ref 0–44)
AST: 97 U/L — ABNORMAL HIGH (ref 15–41)
Albumin: 2.9 g/dL — ABNORMAL LOW (ref 3.5–5.0)
Alkaline Phosphatase: 51 U/L (ref 38–126)
Anion gap: 14 (ref 5–15)
BUN: 23 mg/dL — ABNORMAL HIGH (ref 6–20)
CO2: 22 mmol/L (ref 22–32)
Calcium: 9.1 mg/dL (ref 8.9–10.3)
Chloride: 104 mmol/L (ref 98–111)
Creatinine, Ser: 1.25 mg/dL — ABNORMAL HIGH (ref 0.61–1.24)
GFR, Estimated: >60 mL/min (ref >60)
Glucose, Bld: 191 mg/dL — ABNORMAL HIGH (ref 70–99)
Potassium: 3.9 mmol/L (ref 3.5–5.1)
Sodium: 140 mmol/L (ref 135–145)
Total Bilirubin: 0.5 mg/dL (ref 0.3–1.2)
Total Protein: 7.7 g/dL (ref 6.5–8.1)

## 2020-11-06 LAB — CBC WITH DIFFERENTIAL/PLATELET
Abs Immature Granulocytes: 0.13 10*3/uL — ABNORMAL HIGH (ref 0.00–0.07)
Basophils Absolute: 0 10*3/uL (ref 0.0–0.1)
Basophils Relative: 0 %
Eosinophils Absolute: 0 10*3/uL (ref 0.0–0.5)
Eosinophils Relative: 0 %
HCT: 45.7 % (ref 39.0–52.0)
Hemoglobin: 15 g/dL (ref 13.0–17.0)
Immature Granulocytes: 1 %
Lymphocytes Relative: 27 %
Lymphs Abs: 3 10*3/uL (ref 0.7–4.0)
MCH: 28.3 pg (ref 26.0–34.0)
MCHC: 32.8 g/dL (ref 30.0–36.0)
MCV: 86.2 fL (ref 80.0–100.0)
Monocytes Absolute: 0.6 10*3/uL (ref 0.1–1.0)
Monocytes Relative: 6 %
Neutro Abs: 7.4 10*3/uL (ref 1.7–7.7)
Neutrophils Relative %: 66 %
Platelets: 308 10*3/uL (ref 150–400)
RBC: 5.3 MIL/uL (ref 4.22–5.81)
RDW: 13.7 % (ref 11.5–15.5)
WBC: 11.3 10*3/uL — ABNORMAL HIGH (ref 4.0–10.5)
nRBC: 0 % (ref 0.0–0.2)

## 2020-11-06 LAB — D-DIMER, QUANTITATIVE: D-Dimer, Quant: 1.09 ug/mL-FEU — ABNORMAL HIGH (ref 0.00–0.50)

## 2020-11-06 LAB — GLUCOSE, CAPILLARY
Glucose-Capillary: 205 mg/dL — ABNORMAL HIGH (ref 70–99)
Glucose-Capillary: 286 mg/dL — ABNORMAL HIGH (ref 70–99)
Glucose-Capillary: 227 mg/dL — ABNORMAL HIGH (ref 70–99)
Glucose-Capillary: 341 mg/dL — ABNORMAL HIGH (ref 70–99)

## 2020-11-06 LAB — C-REACTIVE PROTEIN: CRP: 14.3 mg/dL — ABNORMAL HIGH (ref <1.0)

## 2020-11-06 LAB — TSH: TSH: 0.714 u[IU]/mL (ref 0.350–4.500)

## 2020-11-06 LAB — HEMOGLOBIN A1C
Hgb A1c MFr Bld: 7.1 % — ABNORMAL HIGH (ref 4.8–5.6)
Mean Plasma Glucose: 157.07 mg/dL

## 2020-11-06 MED ORDER — ALUM & MAG HYDROXIDE-SIMETH 200-200-20 MG/5ML PO SUSP
30.0000 mL | ORAL | Status: DC | PRN
  Filled 2020-11-06: qty 30

## 2020-11-06 NOTE — Progress Notes (Addendum)
PROGRESS NOTE                                                                                                                                                                                                             Patient Demographics:    Benjamin CampionRichard Munoz, is a 59 y.o. male, DOB - 29-Jul-1961, ZOX:[id number]  Outpatient Primary MD for the patient is Pcp, No   Admit date - 11/04/2020   LOS - 2  Chief Complaint  Patient presents with  . Covid Positive  . Shortness of Breath  . Weakness       Brief Narrative: Patient is a 59 y.o. male with no significant PMHx-tested positive for COVID-19 on 12/5-presented to the ED with approximately 5-6-day history of worsening shortness of breath-found of acute hypoxic respiratory failure due to COVID-19 pneumonia.  COVID-19 vaccinated status: Unvaccinated  Significant Events: 12/8>> [facility] severe hypoxemia due to COVID-19 pneumonia 12/9>> transitioned to heated high flow from salter high flow due to worsening hypoxemia  Significant studies: 12/9>> CTA chest: No PE, Covid pattern pneumonia. 12/10>> chest x-ray: Diffuse bilateral pulmonary infiltrates.  COVID-19 medications: Steroids: 12/8>> Remdesivir: 12/8>> Baricitinib: 12/8>>  Antibiotics: Rocephin: 12/8 x 1 Zithromax: 12/8 x 1  Microbiology data: 12/8 >>blood culture: Negative  Procedures: None  Consults: None  DVT prophylaxis: Prophylactic Lovenox at twice daily dosing.    Subjective:   He feels better today-claims he lying on his belly overnight-still on heated high flow.   Assessment  & Plan :   Acute Hypoxic Resp Failure due to Covid 19 Viral pneumonia: Continues to have severe hypoxemia-remains on heated high flow-no signs of volume overload-does not require Lasix today.  Plans are to continue with steroid/Remdesivir and baricitinib.  Continue to encourage use of incentive spirometry-and  proning whenever possible.  Mobilize with PT/OT.  Fever: afebrile O2 requirements:  SpO2: 97 % O2 Flow Rate (L/min): (S) 25 L/min FiO2 (%): (S) 70 %   COVID-19 Labs: Recent Labs    11/04/20 2050 11/04/20 2203 11/05/20 0640 11/06/20 0110  DDIMER  --  1.73* 1.54* 1.09*  FERRITIN 2,012*  --   --   --   LDH 560*  --   --   --   CRP 16.4*  --  19.0* 14.3*       Component Value Date/Time  BNP 44.4 11/04/2020 2050    Recent Labs  Lab 11/04/20 2050  PROCALCITON 0.46    Lab Results  Component Value Date   SARSCOV2NAA POSITIVE (A) 11/01/2020     Prone/Incentive Spirometry: encouraged patient to lie prone for 3-4 hours at a time for a total of 16 hours a day, and to encourage incentive spirometry use 3-4/hour.  Transaminitis: Secondary to COVID-19-follow.  Newly diagnosed DM-2 (A1c 7.1): Continue SSI-we will require initiation of Metformin when closer to discharge.    Recent Labs    11/05/20 1822 11/06/20 0725  GLUCAP 168* 205*    2 cm left thyroid nodule: Seen incidentally on CT chest-stable for outpatient follow-up with PCP.  TSH stable at 0.714.    Obesity Body mass index is 31.03 kg/m.  GI prophylaxis: PPI  ABG: No results found for: PHART, PCO2ART, PO2ART, HCO3, TCO2, ACIDBASEDEF, O2SAT  Vent Settings: N/A  Condition - Extremely Guarded  Family Communication  :  Spouse Frederik Schmidt [phone]) updated over the phone 12/10  Code Status :  Full Code  Diet :  Diet Order            Diet Heart Room service appropriate? Yes; Fluid consistency: Thin  Diet effective now                  Disposition Plan  :   Status is: Inpatient  Remains inpatient appropriate because:Inpatient level of care appropriate due to severity of illness   Dispo: The patient is from: Home              Anticipated d/c is to: Home              Anticipated d/c date is: > 3 days              Patient currently is not medically stable to d/c.    Barriers to discharge:  Hypoxia requiring O2 supplementation/complete 5 days of IV Remdesivir  Antimicorbials  :    Anti-infectives (From admission, onward)   Start     Dose/Rate Route Frequency Ordered Stop   11/05/20 1000  remdesivir 100 mg in sodium chloride 0.9 % 100 mL IVPB       "Followed by" Linked Group Details   100 mg 200 mL/hr over 30 Minutes Intravenous Daily 11/04/20 2131 11/09/20 0959   11/05/20 0100  cefTRIAXone (ROCEPHIN) 2 g in sodium chloride 0.9 % 100 mL IVPB  Status:  Discontinued        2 g 200 mL/hr over 30 Minutes Intravenous Every 24 hours 11/05/20 0048 11/05/20 1222   11/05/20 0100  azithromycin (ZITHROMAX) 500 mg in sodium chloride 0.9 % 250 mL IVPB  Status:  Discontinued        500 mg 250 mL/hr over 60 Minutes Intravenous Every 24 hours 11/05/20 0048 11/05/20 1222   11/04/20 2200  remdesivir 200 mg in sodium chloride 0.9% 250 mL IVPB       "Followed by" Linked Group Details   200 mg 580 mL/hr over 30 Minutes Intravenous Once 11/04/20 2131 11/05/20 0049      Inpatient Medications  Scheduled Meds: . baricitinib  4 mg Oral Daily  . benzonatate  200 mg Oral TID  . enoxaparin (LOVENOX) injection  40 mg Subcutaneous BID  . influenza vac split quadrivalent PF  0.5 mL Intramuscular Tomorrow-1000  . insulin aspart  0-9 Units Subcutaneous TID WC  . methylPREDNISolone (SOLU-MEDROL) injection  1 mg/kg Intravenous Q12H  . pantoprazole  40 mg Oral  Q1200   Continuous Infusions: . remdesivir 100 mg in NS 100 mL 100 mg (11/06/20 0925)   PRN Meds:.acetaminophen, albuterol, alum & mag hydroxide-simeth, chlorpheniramine-HYDROcodone, clonazepam, guaiFENesin-dextromethorphan, ondansetron **OR** ondansetron (ZOFRAN) IV   Time Spent in minutes  35    See all Orders from today for further details   Jeoffrey Massed M.D on 11/06/2020 at 11:56 AM  To page go to [email or web address] - use universal password  [facility] -  Office  [phone]    Objective:   Vitals:   11/06/20 0409  11/06/20 0723 11/06/20 0742 11/06/20 1014  BP: (!) 147/90 (!) 145/92    Pulse: 100 99  87  Resp:  20  18  Temp: 97.9 F (36.6 C) 97.9 F (36.6 C)    TempSrc: Oral Oral    SpO2: 91% (!) 89% 92% 97%  Weight:      Height:        Wt Readings from Last 3 Encounters:  11/04/20 95.3 kg  11/01/20 95.3 kg  01/09/18 97.5 kg     Intake/Output Summary (Last 24 hours) at 11/06/2020 1156 Last data filed at 11/06/2020 1143 Gross per 24 hour  Intake 101.28 ml  Output 1175 ml  Net -1073.72 ml     Physical Exam Gen Exam:Alert awake-not in any distress HEENT:atraumatic, normocephalic Chest: B/L clear to auscultation anteriorly CVS:S1S2 regular Abdomen:soft non tender, non distended Extremities:no edema Neurology: Non focal Skin: no rash   Data Review:    CBC Recent Labs  Lab 11/01/20 1002 11/04/20 2050 11/05/20 0640 11/06/20 0110  WBC 7.2 10.6* 7.6 11.3*  HGB 15.1 14.8 14.3 15.0  HCT 45.6 45.8 42.7 45.7  PLT 155 221 234 308  MCV 87.4 87.4 86.6 86.2  MCH 28.9 28.2 29.0 28.3  MCHC 33.1 32.3 33.5 32.8  RDW 13.5 13.7 13.9 13.7  LYMPHSABS 2.2 2.0 1.2 3.0  MONOABS 0.3 0.6 0.1 0.6  EOSABS 0.0 0.0 0.0 0.0  BASOSABS 0.0 0.0 0.0 0.0    Chemistries  Recent Labs  Lab 11/01/20 1002 11/04/20 2050 11/05/20 0640 11/06/20 0110  NA 133* 133* 138 140  K 4.0 4.5 4.1 3.9  CL 99 100 101 104  CO2 23 20* 22 22  GLUCOSE 125* 125* 193* 191*  BUN 23*  CREATININE 1.10 1.20 1.22 1.25*  CALCIUM 8.6* 8.2* 8.6* 9.1  AST 41 123* 116* 97*  ALT 33 84* 99* 117*  ALKPHOS 46 47 44 51  BILITOT 0.7 0.8 0.7 0.5   ------------------------------------------------------------------------------------------------------------------ Recent Labs    11/04/20 2050  TRIG 131    Lab Results  Component Value Date   HGBA1C 7.1 (H) 11/06/2020   ------------------------------------------------------------------------------------------------------------------ Recent Labs    11/06/20 0110   TSH 0.714   ------------------------------------------------------------------------------------------------------------------ Recent Labs    11/04/20 2050  FERRITIN 2,012*    Coagulation profile No results for input(s): INR, PROTIME in the last 168 hours.  Recent Labs    11/05/20 0640 11/06/20 0110  DDIMER 1.54* 1.09*    Cardiac Enzymes No results for input(s): CKMB, TROPONINI, MYOGLOBIN in the last 168 hours.  Invalid input(s): CK ------------------------------------------------------------------------------------------------------------------    Component Value Date/Time   BNP 44.4 11/04/2020 2050    Micro Results Recent Results (from the past 240 hour(s))  Resp Panel by RT-PCR (Flu A&B, Covid) Nasopharyngeal Swab     Status: Abnormal   Collection Time: 11/01/20 10:02 AM   Specimen: Nasopharyngeal Swab; Nasopharyngeal(NP) swabs in vial transport medium  Result Value Ref Range Status  SARS Coronavirus 2 by RT PCR POSITIVE (A) NEGATIVE Final    Comment: RESULT CALLED TO, READ BACK BY AND VERIFIED WITH: EVeva Holes RN, ST 1124 11/01/20 D. VANHOOK (NOTE) SARS-CoV-2 target nucleic acids are DETECTED.  The SARS-CoV-2 RNA is generally detectable in upper respiratory specimens during the acute phase of infection. Positive results are indicative of the presence of the identified virus, but do not rule out bacterial infection or co-infection with other pathogens not detected by the test. Clinical correlation with patient history and other diagnostic information is necessary to determine patient infection status. The expected result is Negative.  Fact Sheet for Patients: BloggerCourse.com  Fact Sheet for Healthcare Providers: SeriousBroker.it  This test is not yet approved or cleared by the [place] FDA and  has been authorized for detection and/or diagnosis of SARS-CoV-2 by FDA under an Emergency Use  Authorization (EUA).  This EUA will remain in effect (meaning this tes t can be used) for the duration of  the COVID-19 declaration under Section 564(b)(1) of the Act, 21 U.S.C. section 360bbb-3(b)(1), unless the authorization is terminated or revoked sooner.     Influenza A by PCR NEGATIVE NEGATIVE Final   Influenza B by PCR NEGATIVE NEGATIVE Final    Comment: (NOTE) The Xpert Xpress SARS-CoV-2/FLU/RSV plus assay is intended as an aid in the diagnosis of influenza from Nasopharyngeal swab specimens and should not be used as a sole basis for treatment. Nasal washings and aspirates are unacceptable for Xpert Xpress SARS-CoV-2/FLU/RSV testing.  Fact Sheet for Patients: BloggerCourse.com  Fact Sheet for Healthcare Providers: SeriousBroker.it  This test is not yet approved or cleared by the [place] FDA and has been authorized for detection and/or diagnosis of SARS-CoV-2 by FDA under an Emergency Use Authorization (EUA). This EUA will remain in effect (meaning this test can be used) for the duration of the COVID-19 declaration under Section 564(b)(1) of the Act, 21 U.S.C. section 360bbb-3(b)(1), unless the authorization is terminated or revoked.  Performed at [facility], [address] [place], [place] [address]   Blood Culture (routine x 2)     Status: None (Preliminary result)   Collection Time: 11/04/20  8:52 PM   Specimen: BLOOD  Result Value Ref Range Status   Specimen Description BLOOD LEFT ANTECUBITAL  Final   Special Requests   Final    BOTTLES DRAWN AEROBIC AND ANAEROBIC Blood Culture results may not be optimal due to an inadequate volume of blood received in culture bottles   Culture   Final    NO GROWTH 2 DAYS Performed at [facility], [address], [place], [place] [address]    Report Status PENDING  Incomplete  Blood Culture (routine x 2)     Status: None (Preliminary result)   Collection  Time: 11/04/20  8:55 PM   Specimen: BLOOD  Result Value Ref Range Status   Specimen Description BLOOD RIGHT ANTECUBITAL  Final   Special Requests   Final    BOTTLES DRAWN AEROBIC AND ANAEROBIC Blood Culture adequate volume   Culture   Final    NO GROWTH 2 DAYS Performed at [facility], [address] [place], [place] [address]    Report Status PENDING  Incomplete    Radiology Reports CT Angio Chest PE W and/or Wo Contrast  Result Date: 11/05/2020 CLINICAL DATA:  COVID positive.  Shortness of breath EXAM: CT ANGIOGRAPHY CHEST WITH CONTRAST TECHNIQUE: Multidetector CT imaging of the chest was performed using the  standard protocol during bolus administration of intravenous contrast. Multiplanar CT image reconstructions and MIPs were obtained to evaluate the vascular anatomy. CONTRAST:  OMNIPAQUE IOHEXOL 350 MG/ML SOLN COMPARISON:  Chest x-ray from yesterday and 4 days ago FINDINGS: Cardiovascular: Satisfactory opacification of the pulmonary arteries to the segmental level. No evidence of pulmonary embolism when allowing for levels of motion artifact. Normal heart size. No pericardial effusion. Mediastinum/Nodes: 2 cm left thyroid nodule. No adenopathy in the mediastinum. Lungs/Pleura: Patchy ground-glass opacity throughout all lobes with intervening normal lung. Lung volumes are low. No visible effusion or pneumothorax. Upper Abdomen: There could be hepatic steatosis, but certainty is limited by arterial timing. Musculoskeletal: Spondylosis. Review of the MIP images confirms the above findings. IMPRESSION: 1. COVID pattern pneumonia. 2. Motion degraded chest CTA with no evidence of pulmonary embolism. 3. 2 cm left thyroid nodule. Recommend thyroid US.(Ref: J Am Coll Radiol. 2015 Feb;12(2): 143-50). Electronically Signed   By: Marnee Spring M.D.   On: 11/05/2020 05:09   DG Chest Port 1 View  Result Date: 11/06/2020 CLINICAL DATA:  Shortness of breath.  COVID. EXAM: PORTABLE CHEST 1  VIEW COMPARISON:  CT 11/05/2020.  Chest x-ray 11/04/2020. FINDINGS: Heart size stable. Diffuse bilateral pulmonary infiltrates/edema. Similar findings noted on prior exam. No pleural effusion or pneumothorax. Degenerative change thoracic spine. IMPRESSION: Diffuse bilateral pulmonary infiltrates/edema. Similar findings on prior exam. Findings consistent with COVID pneumonia. Electronically Signed   By: Maisie Fus  Register   On: 11/06/2020 08:01   DG Chest Port 1 View  Result Date: 11/04/2020 CLINICAL DATA:  59 year old male positive COVID-19.  Hypoxia. EXAM: PORTABLE CHEST 1 VIEW COMPARISON:  Portable chest 11/01/2020. FINDINGS: Portable AP semi upright view at 2016 hours. Mildly lower lung volumes and moderately progressed Patchy and confluent bilateral mid and lower lung opacity. Opacity is somewhat more extensive on the right now including some right upper lung involvement. The left upper lung remains spared. No pneumothorax. No pleural effusion is evident. Stable cardiac size and mediastinal contours. Paucity of bowel gas in the upper abdomen No acute osseous abnormality identified. Visualized tracheal air column is within normal limits. IMPRESSION: Progressed bilateral COVID-19 pneumonia. Electronically Signed   By: Odessa Fleming M.D.   On: 11/04/2020 20:34   DG Chest Portable 1 View  Result Date: 11/01/2020 CLINICAL DATA:  Fever, chills, body aches. EXAM: PORTABLE CHEST 1 VIEW COMPARISON:  None. FINDINGS: Borderline cardiomegaly. Subtle patchy ground-glass opacities bilaterally. No pleural effusion or pneumothorax is seen. Osseous structures about the chest are unremarkable. IMPRESSION: 1. Subtle patchy ground-glass opacities bilaterally, suspicious for multifocal pneumonia. 2. Borderline cardiomegaly. Electronically Signed   By: Bary Khoa M.D.   On: 11/01/2020 10:33

## 2020-11-07 ENCOUNTER — Inpatient Hospital Stay (HOSPITAL_COMMUNITY): Payer: BLUE CROSS/BLUE SHIELD

## 2020-11-07 LAB — GLUCOSE, CAPILLARY
Glucose-Capillary: 220 mg/dL — ABNORMAL HIGH (ref 70–99)
Glucose-Capillary: 346 mg/dL — ABNORMAL HIGH (ref 70–99)
Glucose-Capillary: 330 mg/dL — ABNORMAL HIGH (ref 70–99)
Glucose-Capillary: 322 mg/dL — ABNORMAL HIGH (ref 70–99)

## 2020-11-07 LAB — CBC WITH DIFFERENTIAL/PLATELET
Abs Immature Granulocytes: 0.19 10*3/uL — ABNORMAL HIGH (ref 0.00–0.07)
Basophils Absolute: 0 10*3/uL (ref 0.0–0.1)
Basophils Relative: 0 %
Eosinophils Absolute: 0 10*3/uL (ref 0.0–0.5)
Eosinophils Relative: 0 %
HCT: 47 % (ref 39.0–52.0)
Hemoglobin: 15.2 g/dL (ref 13.0–17.0)
Immature Granulocytes: 1 %
Lymphocytes Relative: 15 %
Lymphs Abs: 2.1 10*3/uL (ref 0.7–4.0)
MCH: 28.3 pg (ref 26.0–34.0)
MCHC: 32.3 g/dL (ref 30.0–36.0)
MCV: 87.5 fL (ref 80.0–100.0)
Monocytes Absolute: 0.7 10*3/uL (ref 0.1–1.0)
Monocytes Relative: 5 %
Neutro Abs: 11.1 10*3/uL — ABNORMAL HIGH (ref 1.7–7.7)
Neutrophils Relative %: 79 %
Platelets: 275 10*3/uL (ref 150–400)
RBC: 5.37 MIL/uL (ref 4.22–5.81)
RDW: 13.9 % (ref 11.5–15.5)
WBC: 14.7 10*3/uL — ABNORMAL HIGH (ref 4.0–10.5)
nRBC: 0 % (ref 0.0–0.2)

## 2020-11-07 LAB — D-DIMER, QUANTITATIVE: D-Dimer, Quant: 0.85 ug/mL-FEU — ABNORMAL HIGH (ref 0.00–0.50)

## 2020-11-07 LAB — COMPREHENSIVE METABOLIC PANEL
ALT: 94 U/L — ABNORMAL HIGH (ref 0–44)
AST: 48 U/L — ABNORMAL HIGH (ref 15–41)
Albumin: 2.9 g/dL — ABNORMAL LOW (ref 3.5–5.0)
Alkaline Phosphatase: 49 U/L (ref 38–126)
Anion gap: 13 (ref 5–15)
BUN: 28 mg/dL — ABNORMAL HIGH (ref 6–20)
CO2: 25 mmol/L (ref 22–32)
Calcium: 9.3 mg/dL (ref 8.9–10.3)
Chloride: 104 mmol/L (ref 98–111)
Creatinine, Ser: 1.37 mg/dL — ABNORMAL HIGH (ref 0.61–1.24)
GFR, Estimated: 59 mL/min — ABNORMAL LOW (ref >60)
Glucose, Bld: 256 mg/dL — ABNORMAL HIGH (ref 70–99)
Potassium: 4.3 mmol/L (ref 3.5–5.1)
Sodium: 142 mmol/L (ref 135–145)
Total Bilirubin: 0.3 mg/dL (ref 0.3–1.2)
Total Protein: 7.2 g/dL (ref 6.5–8.1)

## 2020-11-07 LAB — C-REACTIVE PROTEIN: CRP: 6.1 mg/dL — ABNORMAL HIGH (ref <1.0)

## 2020-11-07 MED ORDER — INSULIN ASPART 100 UNIT/ML ~~LOC~~ SOLN
0.0000 [IU] | Freq: Three times a day (TID) | SUBCUTANEOUS | Status: DC
  Administered 2020-11-07 (×2): 15 [IU] via SUBCUTANEOUS

## 2020-11-07 MED ORDER — INSULIN GLARGINE 100 UNIT/ML ~~LOC~~ SOLN
10.0000 [IU] | Freq: Every day | SUBCUTANEOUS | Status: DC
  Administered 2020-11-07: 12:00:00 10 [IU] via SUBCUTANEOUS
  Filled 2020-11-07 (×2): qty 0.1

## 2020-11-07 MED ORDER — INSULIN ASPART 100 UNIT/ML ~~LOC~~ SOLN
4.0000 [IU] | Freq: Three times a day (TID) | SUBCUTANEOUS | Status: DC
  Administered 2020-11-07 (×2): 4 [IU] via SUBCUTANEOUS

## 2020-11-07 MED ORDER — INSULIN ASPART 100 UNIT/ML ~~LOC~~ SOLN
0.0000 [IU] | Freq: Every day | SUBCUTANEOUS | Status: DC
  Administered 2020-11-07: 22:00:00 4 [IU] via SUBCUTANEOUS
  Administered 2020-11-08: 22:00:00 3 [IU] via SUBCUTANEOUS
  Administered 2020-11-09: 22:00:00 2 [IU] via SUBCUTANEOUS
  Administered 2020-11-10: 22:00:00 3 [IU] via SUBCUTANEOUS
  Administered 2020-11-11 – 2020-11-12 (×2): 2 [IU] via SUBCUTANEOUS

## 2020-11-07 MED ORDER — INSULIN ASPART 100 UNIT/ML ~~LOC~~ SOLN
0.0000 [IU] | Freq: Three times a day (TID) | SUBCUTANEOUS | Status: DC
  Administered 2020-11-08: 12:00:00 11 [IU] via SUBCUTANEOUS
  Administered 2020-11-08: 09:00:00 4 [IU] via SUBCUTANEOUS
  Administered 2020-11-08: 18:00:00 15 [IU] via SUBCUTANEOUS
  Administered 2020-11-09: 08:00:00 4 [IU] via SUBCUTANEOUS
  Administered 2020-11-09: 12:00:00 11 [IU] via SUBCUTANEOUS
  Administered 2020-11-09: 17:00:00 15 [IU] via SUBCUTANEOUS
  Administered 2020-11-10: 10:00:00 3 [IU] via SUBCUTANEOUS
  Administered 2020-11-10 – 2020-11-11 (×3): 11 [IU] via SUBCUTANEOUS
  Administered 2020-11-11: 12:00:00 7 [IU] via SUBCUTANEOUS
  Administered 2020-11-11: 08:00:00 3 [IU] via SUBCUTANEOUS
  Administered 2020-11-12 – 2020-11-13 (×3): 7 [IU] via SUBCUTANEOUS
  Administered 2020-11-13: 08:00:00 3 [IU] via SUBCUTANEOUS

## 2020-11-07 NOTE — Evaluation (Signed)
Physical Therapy Evaluation Patient Details Name: Benjamin Munoz MRN: [id number] DOB: 06/18/1961 Today's Date: 11/07/2020   History of Present Illness  59 y.o. male with medical history significant of no chronic medical problems. Seen in ED on 12/6.  Diagnosed with COVID. Returned to ED 11/05/20 with increasing symptoms.  Clinical Impression   Pt admitted with above diagnosis. Patient independent PTA, working for a Dentist for the less fortunate. His bedroom and full bath are upstairs in his home. Patient currently on HHFNC 35L 100% FiO2 with limited mobility due to equipment and pt's level of fatigue after OT session immediately prior to PT evaluation. Pt currently with functional limitations due to the deficits listed below (see PT Problem List). Pt will benefit from skilled PT to increase their independence and safety with mobility to allow discharge to the venue listed below.       Follow Up Recommendations Home health PT;Supervision - Intermittent (may progress to no PT needs)    Equipment Recommendations  None recommended by PT    Recommendations for Other Services       Precautions / Restrictions Precautions Precautions: Fall Precaution Comments: watch SpO2 Restrictions Weight Bearing Restrictions: No      Mobility  Bed Mobility               General bed mobility comments: up in chair, returned to chair    Transfers Overall transfer level: Needs assistance Equipment used: None Transfers: Sit to/from Stand Sit to Stand: Supervision         General transfer comment: supervision for lines  Ambulation/Gait             General Gait Details: deferred; had just completed OT eval prior to PT arrival; on [facility] with limited room for walking and pt fatigued  Stairs            Wheelchair Mobility    Modified Rankin (Stroke Patients Only)       Balance Overall balance assessment: Mild deficits observed, not formally tested                                            Pertinent Vitals/Pain Pain Assessment: No/denies pain    Home Living Family/patient expects to be discharged to:: Private residence Living Arrangements: Spouse/significant other;Children (two college age children) Available Help at Discharge: Family;Available 24 hours/day Type of Home: House Home Access: Level entry     Home Layout: Two level;Bed/bath upstairs;1/2 bath on main level Home Equipment: Shower seat - built in      Prior Function Level of Independence: Independent         Comments: works for [employer]	[place]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     Higher education careers adviser        Extremity/Trunk Assessment   Upper Extremity Assessment Upper Extremity Assessment: Defer to OT evaluation    Lower Extremity Assessment Lower Extremity Assessment: Overall WFL for tasks assessed    Cervical / Trunk Assessment Cervical / Trunk Assessment: Normal  Communication   Communication: No difficulties  Cognition Arousal/Alertness: Awake/alert Behavior During Therapy: WFL for tasks assessed/performed Overall Cognitive Status: Within Functional Limits for tasks assessed                                        General  Comments      Exercises Other Exercises Other Exercises: Educated in handout and issued green band (level 3). Seated and bil-LAQ x 10, marching x 5, resisted elbow flexion x5, resisted elbow extension x 5, horizontal shoulder abdct with scapular retraction x 5; PNF D1 pattern x 3 each   Assessment/Plan    PT Assessment Patient needs continued PT services  PT Problem List Decreased activity tolerance;Decreased balance;Decreased mobility;Decreased knowledge of use of DME;Cardiopulmonary status limiting activity       PT Treatment Interventions DME instruction;Gait training;Stair training;Functional mobility training;Therapeutic activities;Therapeutic exercise;Balance training;Patient/family  education    PT Goals (Current goals can be found in the Care Plan section)  Acute Rehab PT Goals Patient Stated Goal: return to independent baseline PT Goal Formulation: With patient Time For Goal Achievement: 11/21/20 Potential to Achieve Goals: Good    Frequency Min 3X/week   Barriers to discharge Inaccessible home environment upstairs bed/bathroom    Co-evaluation               AM-PAC PT "6 Clicks" Mobility  Outcome Measure Help needed turning from your back to your side while in a flat bed without using bedrails?: None Help needed moving from lying on your back to sitting on the side of a flat bed without using bedrails?: A Little Help needed moving to and from a bed to a chair (including a wheelchair)?: A Little Help needed standing up from a chair using your arms (e.g., wheelchair or bedside chair)?: A Little Help needed to walk in hospital room?: A Little Help needed climbing 3-5 steps with a railing? : Total 6 Click Score: 17    End of Session Equipment Utilized During Treatment: Oxygen (35L 100%) Activity Tolerance: Patient tolerated treatment well Patient left: in chair;with call bell/phone within reach   PT Visit Diagnosis: Difficulty in walking, not elsewhere classified (R26.2)    Time: 0093-8182 PT Time Calculation (min) (ACUTE ONLY): 23 min   Charges:   PT Evaluation $PT Eval Moderate Complexity: 1 Mod           Jerolyn Center, PT Pager [phone]   Zena Amos 11/07/2020, 5:36 PM

## 2020-11-07 NOTE — Progress Notes (Signed)
PT Cancellation Note  Patient Details Name: Benjamin Munoz MRN: [id number] DOB: August 26, 1961   Cancelled Treatment:    Reason Eval/Treat Not Completed: Other (comment)  Erin, RN reports pt just turned prone ~10 minutes ago. His sats are coming up. Will see later today.    Jerolyn Center, PT Pager [phone]   Zena Amos 11/07/2020, 11:07 AM

## 2020-11-07 NOTE — Progress Notes (Signed)
   11/06/20 2349  Assess: MEWS Score  Temp 98.1 F (36.7 C)  BP 136/89  Pulse Rate 82  ECG Heart Rate 82  Resp (!) 40  Level of Consciousness Alert  SpO2 95 %  O2 Device HFNC  Patient Activity (if Appropriate) In bed  O2 Flow Rate (L/min) 25 L/min  FiO2 (%) 70 %  Assess: MEWS Score  MEWS Temp 0  MEWS Systolic 0  MEWS Pulse 0  MEWS RR 3  MEWS LOC 0  MEWS Score 3  MEWS Score Color Yellow  Assess: if the MEWS score is Yellow or Red  Were vital signs taken at a resting state? Yes  Focused Assessment No change from prior assessment  Early Detection of Sepsis Score *See Row Information* Low  MEWS guidelines implemented *See Row Information* Yes  Treat  Pain Scale 0-10  Pain Score 0  Take Vital Signs  Increase Vital Sign Frequency  Yellow: Q 2hr X 2 then Q 4hr X 2, if remains yellow, continue Q 4hrs  Escalate  MEWS: Escalate Yellow: discuss with charge nurse/RN and consider discussing with provider and RRT  Notify: Charge Nurse/RN  Name of Charge Nurse/RN Notified Scientist, research (life sciences)  Date Charge Nurse/RN Notified 11/07/20  Time Charge Nurse/RN Notified 0000  Document  Patient Outcome Other (Comment) (pt stable on heated high flow)  Progress note created (see row info) Yes

## 2020-11-07 NOTE — Progress Notes (Signed)
PROGRESS NOTE                                                                                                                                                                                                             Patient Demographics:    Benjamin Munoz, is a 59 y.o. male, DOB - [birth year], CNO:[id number]  Outpatient Primary MD for the patient is Pcp, No   Admit date - 11/04/2020   LOS - 3  Chief Complaint  Patient presents with  . Covid Positive  . Shortness of Breath  . Weakness       Brief Narrative: Patient is a 59 y.o. male with no significant PMHx-tested positive for COVID-19 on 12/5-presented to the ED with approximately 5-6-day history of worsening shortness of breath-found of acute hypoxic respiratory failure due to COVID-19 pneumonia.  COVID-19 vaccinated status: Unvaccinated  Significant Events: 12/8>> Admit to [facility] for severe hypoxemia due to COVID-19 pneumonia 12/9>> transitioned to heated high flow from salter high flow due to worsening hypoxemia  Significant studies: 12/9>> CTA chest: No PE, Covid pattern pneumonia. 12/10>> chest x-ray: Diffuse bilateral pulmonary infiltrates.  COVID-19 medications: Steroids: 12/8>> Remdesivir: 12/8>> Baricitinib: 12/8>>  Antibiotics: Rocephin: 12/8 x 1 Zithromax: 12/8 x 1  Microbiology data: 12/8 >>blood culture: Negative  Procedures: None  Consults: None  DVT prophylaxis: Prophylactic Lovenox at twice daily dosing.    Subjective:   No major issues overnight-he appears unchanged-denies any chest pain    Assessment  & Plan :   Acute Hypoxic Resp Failure due to Covid 19 Viral pneumonia: Continues to have severe hypoxemia-remains on heated high flow-no evidence of volume overload.  Does not require diuretics.  Continue steroid/Remdesivir/baricitinib.  .  Continue to encourage use of incentive spirometry-and proning whenever possible.  Mobilize  with PT/OT.  Fever: afebrile O2 requirements:  SpO2: (!) 88 % O2 Flow Rate (L/min): 30 L/min FiO2 (%): 100 %   COVID-19 Labs: Recent Labs    11/04/20 2050 11/04/20 2203 11/05/20 0640 11/06/20 0110 11/07/20 0240  DDIMER  --    < > 1.54* 1.09* 0.85*  FERRITIN 2,012*  --   --   --   --   LDH 560*  --   --   --   --   CRP 16.4*  --  19.0* 14.3* 6.1*   < > =  values in this interval not displayed.       Component Value Date/Time   BNP 44.4 11/04/2020 2050    Recent Labs  Lab 11/04/20 2050  PROCALCITON 0.46    Lab Results  Component Value Date   SARSCOV2NAA POSITIVE (A) 11/01/2020     Prone/Incentive Spirometry: encouraged patient to lie prone for 3-4 hours at a time for a total of 16 hours a day, and to encourage incentive spirometry use 3-4/hour.  Transaminitis: Secondary to COVID-19-follow.  AKI: Mild-stable for close monitoring for now.  Newly diagnosed DM-2 (A1c 7.1) with uncontrolled hyperglycemia due to steroid use: Add Lantus 10 units-change to resistant scale-add 4 units of NovoLog with meals.  Plan to initiate Metformin when closer to discharge.    Recent Labs    11/06/20 1621 11/06/20 2108 11/07/20 0722  GLUCAP 227* 341* 220*    2 cm left thyroid nodule: Seen incidentally on CT chest-stable for outpatient follow-up with PCP.  TSH stable at 0.714.    Obesity Body mass index is 31.03 kg/m.  GI prophylaxis: PPI  ABG: No results found for: PHART, PCO2ART, PO2ART, HCO3, TCO2, ACIDBASEDEF, O2SAT  Vent Settings: N/A  Condition - Extremely Guarded  Family Communication  :  Spouse Frederik Schmidt [phone]) updated over the phone 12/11  Code Status :  Full Code  Diet :  Diet Order            Diet Heart Room service appropriate? Yes; Fluid consistency: Thin  Diet effective now                  Disposition Plan  :   Status is: Inpatient  Remains inpatient appropriate because:Inpatient level of care appropriate due to severity of  illness   Dispo: The patient is from: Home              Anticipated d/c is to: Home              Anticipated d/c date is: > 3 days              Patient currently is not medically stable to d/c.    Barriers to discharge: Hypoxia requiring O2 supplementation/complete 5 days of IV Remdesivir  Antimicorbials  :    Anti-infectives (From admission, onward)   Start     Dose/Rate Route Frequency Ordered Stop   11/05/20 1000  remdesivir 100 mg in sodium chloride 0.9 % 100 mL IVPB       "Followed by" Linked Group Details   100 mg 200 mL/hr over 30 Minutes Intravenous Daily 11/04/20 2131 11/09/20 0959   11/05/20 0100  cefTRIAXone (ROCEPHIN) 2 g in sodium chloride 0.9 % 100 mL IVPB  Status:  Discontinued        2 g 200 mL/hr over 30 Minutes Intravenous Every 24 hours 11/05/20 0048 11/05/20 1222   11/05/20 0100  azithromycin (ZITHROMAX) 500 mg in sodium chloride 0.9 % 250 mL IVPB  Status:  Discontinued        500 mg 250 mL/hr over 60 Minutes Intravenous Every 24 hours 11/05/20 0048 11/05/20 1222   11/04/20 2200  remdesivir 200 mg in sodium chloride 0.9% 250 mL IVPB       "Followed by" Linked Group Details   200 mg 580 mL/hr over 30 Minutes Intravenous Once 11/04/20 2131 11/05/20 0049      Inpatient Medications  Scheduled Meds: . baricitinib  4 mg Oral Daily  . benzonatate  200 mg Oral TID  . enoxaparin (  LOVENOX) injection  40 mg Subcutaneous BID  . influenza vac split quadrivalent PF  0.5 mL Intramuscular Tomorrow-1000  . insulin aspart  0-9 Units Subcutaneous TID WC  . methylPREDNISolone (SOLU-MEDROL) injection  1 mg/kg Intravenous Q12H  . pantoprazole  40 mg Oral Q1200   Continuous Infusions: . remdesivir 100 mg in NS 100 mL 100 mg (11/07/20 0836)   PRN Meds:.acetaminophen, albuterol, alum & mag hydroxide-simeth, chlorpheniramine-HYDROcodone, clonazepam, guaiFENesin-dextromethorphan, ondansetron **OR** ondansetron (ZOFRAN) IV   Time Spent in minutes  35    See all Orders from  today for further details   Jeoffrey Massed M.D on 11/07/2020 at 10:18 AM  To page go to [email or web address] - use universal password  [facility] -  Office  [phone]    Objective:   Vitals:   11/07/20 0156 11/07/20 0421 11/07/20 0717 11/07/20 0841  BP: 132/86 137/83 126/83   Pulse: 90 80 95 (!) 102  Resp: (!) 36 (!) 36 (!) 31 (!) 24  Temp: 97.8 F (36.6 C) 97.8 F (36.6 C) 98.3 F (36.8 C)   TempSrc: Oral Oral Oral   SpO2: 93% 94% 91% (!) 88%  Weight:      Height:        Wt Readings from Last 3 Encounters:  11/04/20 95.3 kg  11/01/20 95.3 kg  01/09/18 97.5 kg     Intake/Output Summary (Last 24 hours) at 11/07/2020 1018 Last data filed at 11/07/2020 0900 Gross per 24 hour  Intake 240 ml  Output 775 ml  Net -535 ml     Physical Exam Gen Exam:Alert awake-not in any distress HEENT:atraumatic, normocephalic Chest: B/L clear to auscultation anteriorly CVS:S1S2 regular Abdomen:soft non tender, non distended Extremities:no edema Neurology: Non focal Skin: no rash   Data Review:    CBC Recent Labs  Lab 11/01/20 1002 11/04/20 2050 11/05/20 0640 11/06/20 0110 11/07/20 0240  WBC 7.2 10.6* 7.6 11.3* 14.7*  HGB 15.1 14.8 14.3 15.0 15.2  HCT 45.6 45.8 42.7 45.7 47.0  PLT 155 221 234 308 275  MCV 87.4 87.4 86.6 86.2 87.5  MCH 28.9 28.2 29.0 28.3 28.3  MCHC 33.1 32.3 33.5 32.8 32.3  RDW 13.5 13.7 13.9 13.7 13.9  LYMPHSABS 2.2 2.0 1.2 3.0 2.1  MONOABS 0.3 0.6 0.1 0.6 0.7  EOSABS 0.0 0.0 0.0 0.0 0.0  BASOSABS 0.0 0.0 0.0 0.0 0.0    Chemistries  Recent Labs  Lab 11/01/20 1002 11/04/20 2050 11/05/20 0640 11/06/20 0110 11/07/20 0240  NA 133* 133* 138 140 142  K 4.0 4.5 4.1 3.9 4.3  CL 99 100 101 104 104  CO2 23 20* GLUCOSE 125* 125* 193* 191* 256*  BUN 23* 28*  CREATININE 1.10 1.20 1.22 1.25* 1.37*  CALCIUM 8.6* 8.2* 8.6* 9.1 9.3  AST 41 123* 116* 97* 48*  ALT 33 84* 99* 117* 94*  ALKPHOS 46 47 44 51 49  BILITOT 0.7  0.8 0.7 0.5 0.3   ------------------------------------------------------------------------------------------------------------------ Recent Labs    11/04/20 2050  TRIG 131    Lab Results  Component Value Date   HGBA1C 7.1 (H) 11/06/2020   ------------------------------------------------------------------------------------------------------------------ Recent Labs    11/06/20 0110  TSH 0.714   ------------------------------------------------------------------------------------------------------------------ Recent Labs    11/04/20 2050  FERRITIN 2,012*    Coagulation profile No results for input(s): INR, PROTIME in the last 168 hours.  Recent Labs    11/06/20 0110 11/07/20 0240  DDIMER 1.09* 0.85*    Cardiac Enzymes No results for  input(s): CKMB, TROPONINI, MYOGLOBIN in the last 168 hours.  Invalid input(s): CK ------------------------------------------------------------------------------------------------------------------    Component Value Date/Time   BNP 44.4 11/04/2020 2050    Micro Results Recent Results (from the past 240 hour(s))  Resp Panel by RT-PCR (Flu A&B, Covid) Nasopharyngeal Swab     Status: Abnormal   Collection Time: 11/01/20 10:02 AM   Specimen: Nasopharyngeal Swab; Nasopharyngeal(NP) swabs in vial transport medium  Result Value Ref Range Status   SARS Coronavirus 2 by RT PCR POSITIVE (A) NEGATIVE Final    Comment: RESULT CALLED TO, READ BACK BY AND VERIFIED WITH: EVeva Holes. VAZQUEZ RN, ST 1124 11/01/20 D. VANHOOK (NOTE) SARS-CoV-2 target nucleic acids are DETECTED.  The SARS-CoV-2 RNA is generally detectable in upper respiratory specimens during the acute phase of infection. Positive results are indicative of the presence of the identified virus, but do not rule out bacterial infection or co-infection with other pathogens not detected by the test. Clinical correlation with patient history and other diagnostic information is necessary to  determine patient infection status. The expected result is Negative.  Fact Sheet for Patients: BloggerCourse.comhttps://www.fda.gov/media/152166/download  Fact Sheet for Healthcare Providers: SeriousBroker.ithttps://www.fda.gov/media/152162/download  This test is not yet approved or cleared by the [place] FDA and  has been authorized for detection and/or diagnosis of SARS-CoV-2 by FDA under an Emergency Use Authorization (EUA).  This EUA will remain in effect (meaning this tes t can be used) for the duration of  the COVID-19 declaration under Section 564(b)(1) of the Act, 21 U.S.C. section 360bbb-3(b)(1), unless the authorization is terminated or revoked sooner.     Influenza A by PCR NEGATIVE NEGATIVE Final   Influenza B by PCR NEGATIVE NEGATIVE Final    Comment: (NOTE) The Xpert Xpress SARS-CoV-2/FLU/RSV plus assay is intended as an aid in the diagnosis of influenza from Nasopharyngeal swab specimens and should not be used as a sole basis for treatment. Nasal washings and aspirates are unacceptable for Xpert Xpress SARS-CoV-2/FLU/RSV testing.  Fact Sheet for Patients: BloggerCourse.comhttps://www.fda.gov/media/152166/download  Fact Sheet for Healthcare Providers: SeriousBroker.ithttps://www.fda.gov/media/152162/download  This test is not yet approved or cleared by the [place] FDA and has been authorized for detection and/or diagnosis of SARS-CoV-2 by FDA under an Emergency Use Authorization (EUA). This EUA will remain in effect (meaning this test can be used) for the duration of the COVID-19 declaration under Section 564(b)(1) of the Act, 21 U.S.C. section 360bbb-3(b)(1), unless the authorization is terminated or revoked.  Performed at [facility], [address] [place], [place] [address]   Blood Culture (routine x 2)     Status: None (Preliminary result)   Collection Time: 11/04/20  8:52 PM   Specimen: BLOOD  Result Value Ref Range Status   Specimen Description BLOOD LEFT ANTECUBITAL  Final   Special Requests    Final    BOTTLES DRAWN AEROBIC AND ANAEROBIC Blood Culture results may not be optimal due to an inadequate volume of blood received in culture bottles   Culture   Final    NO GROWTH 2 DAYS Performed at [facility], [address] [place], [place] [address]    Report Status PENDING  Incomplete  Blood Culture (routine x 2)     Status: None (Preliminary result)   Collection Time: 11/04/20  8:55 PM   Specimen: BLOOD  Result Value Ref Range Status   Specimen Description BLOOD RIGHT ANTECUBITAL  Final   Special Requests   Final    BOTTLES DRAWN AEROBIC AND ANAEROBIC Blood  Culture adequate volume   Culture   Final    NO GROWTH 2 DAYS Performed at [facility], [address], [place], [place] [address]    Report Status PENDING  Incomplete    Radiology Reports CT Angio Chest PE W and/or Wo Contrast  Result Date: 11/05/2020 CLINICAL DATA:  COVID positive.  Shortness of breath EXAM: CT ANGIOGRAPHY CHEST WITH CONTRAST TECHNIQUE: Multidetector CT imaging of the chest was performed using the standard protocol during bolus administration of intravenous contrast. Multiplanar CT image reconstructions and MIPs were obtained to evaluate the vascular anatomy. CONTRAST:  OMNIPAQUE IOHEXOL 350 MG/ML SOLN COMPARISON:  Chest x-ray from yesterday and 4 days ago FINDINGS: Cardiovascular: Satisfactory opacification of the pulmonary arteries to the segmental level. No evidence of pulmonary embolism when allowing for levels of motion artifact. Normal heart size. No pericardial effusion. Mediastinum/Nodes: 2 cm left thyroid nodule. No adenopathy in the mediastinum. Lungs/Pleura: Patchy ground-glass opacity throughout all lobes with intervening normal lung. Lung volumes are low. No visible effusion or pneumothorax. Upper Abdomen: There could be hepatic steatosis, but certainty is limited by arterial timing. Musculoskeletal: Spondylosis. Review of the MIP images confirms the above findings.  IMPRESSION: 1. COVID pattern pneumonia. 2. Motion degraded chest CTA with no evidence of pulmonary embolism. 3. 2 cm left thyroid nodule. Recommend thyroid US.(Ref: J Am Coll Radiol. 2015 Feb;12(2): 143-50). Electronically Signed   By: Marnee Spring M.D.   On: 11/05/2020 05:09   DG Chest Port 1 View  Result Date: 11/06/2020 CLINICAL DATA:  Shortness of breath.  COVID. EXAM: PORTABLE CHEST 1 VIEW COMPARISON:  CT 11/05/2020.  Chest x-ray 11/04/2020. FINDINGS: Heart size stable. Diffuse bilateral pulmonary infiltrates/edema. Similar findings noted on prior exam. No pleural effusion or pneumothorax. Degenerative change thoracic spine. IMPRESSION: Diffuse bilateral pulmonary infiltrates/edema. Similar findings on prior exam. Findings consistent with COVID pneumonia. Electronically Signed   By: Maisie Fus  Register   On: 11/06/2020 08:01   DG Chest Port 1 View  Result Date: 11/04/2020 CLINICAL DATA:  59 year old male positive COVID-19.  Hypoxia. EXAM: PORTABLE CHEST 1 VIEW COMPARISON:  Portable chest 11/01/2020. FINDINGS: Portable AP semi upright view at 2016 hours. Mildly lower lung volumes and moderately progressed Patchy and confluent bilateral mid and lower lung opacity. Opacity is somewhat more extensive on the right now including some right upper lung involvement. The left upper lung remains spared. No pneumothorax. No pleural effusion is evident. Stable cardiac size and mediastinal contours. Paucity of bowel gas in the upper abdomen No acute osseous abnormality identified. Visualized tracheal air column is within normal limits. IMPRESSION: Progressed bilateral COVID-19 pneumonia. Electronically Signed   By: Odessa Fleming M.D.   On: 11/04/2020 20:34   DG Chest Portable 1 View  Result Date: 11/01/2020 CLINICAL DATA:  Fever, chills, body aches. EXAM: PORTABLE CHEST 1 VIEW COMPARISON:  None. FINDINGS: Borderline cardiomegaly. Subtle patchy ground-glass opacities bilaterally. No pleural effusion or pneumothorax is  seen. Osseous structures about the chest are unremarkable. IMPRESSION: 1. Subtle patchy ground-glass opacities bilaterally, suspicious for multifocal pneumonia. 2. Borderline cardiomegaly. Electronically Signed   By: Bary Latrail M.D.   On: 11/01/2020 10:33

## 2020-11-07 NOTE — Evaluation (Addendum)
Occupational Therapy Evaluation Patient Details Name: Benjamin Munoz MRN: [id number] DOB: January 17, 1961 Today's Date: 11/07/2020    History of Present Illness 59 y.o. male with medical history significant of no chronic medical problems. Seen in ED on 12/6.  Diagnosed with COVID. Returned to ED 11/05/20 with increasing symptoms.   Clinical Impression   PTA pt living with family and functioning at independent community level. He is very involved in community support organizations at baseline. At time of eval, pt is up in chair and able to complete sit <> stands at supervision level. Pt is on 30L HHFNC at this time. He was able to march in place for ~3 mins with SpO2 dropping to 84%. Pt was instructed to sit and practice pursed lip breathing. After 5 mins, he was able to recover SpO2 >90%. He then completed walking forward and backward a few steps each for ~3 mins with SpO2 ~93%. Pt then completed IS pulling ; flutter also completed. Education on energy conservation was initiated. Pt left up in chair at end of session. Anticipate he will not need post acute OT follow up, but will continue to follow acutely progress ADLs and facilitate safe d/c per POC listed below.  Vitals: Pt on 30L HHFNC- dropping as low as 84% with mobility. He recovered with pursed lip breathing and seated rest after ~3 mins.    Follow Up Recommendations  No OT follow up    Equipment Recommendations  Tub/shower seat    Recommendations for Other Services       Precautions / Restrictions Precautions Precautions: Fall Precaution Comments: watch SpO2 Restrictions Weight Bearing Restrictions: No      Mobility Bed Mobility               General bed mobility comments: up in chair, returned to chair    Transfers Overall transfer level: Needs assistance Equipment used: None Transfers: Sit to/from Stand Sit to Stand: Supervision         General transfer comment: supervision for lines    Balance  Overall balance assessment: Mild deficits observed, not formally tested                                         ADL either performed or assessed with clinical judgement   ADL Overall ADL's : Modified independent                                       General ADL Comments: Pt is currently functioning at mod I level (increased time) for ADL. He requires increased time and cues for pursed lip breathing and other energy conservation techniques to complete ADLs     Vision Patient Visual Report: No change from baseline       Perception     Praxis      Pertinent Vitals/Pain Pain Assessment: No/denies pain     Hand Dominance     Extremity/Trunk Assessment Upper Extremity Assessment Upper Extremity Assessment: Overall WFL for tasks assessed   Lower Extremity Assessment Lower Extremity Assessment: Defer to PT evaluation       Communication Communication Communication: No difficulties   Cognition Arousal/Alertness: Awake/alert Behavior During Therapy: WFL for tasks assessed/performed Overall Cognitive Status: Within Functional Limits for tasks assessed  General Comments       Exercises     Shoulder Instructions      Home Living Family/patient expects to be discharged to:: Private residence Living Arrangements: Spouse/significant other;Children (two college age children) Available Help at Discharge: Family;Available 24 hours/day Type of Home: House       Home Layout: Two level;Bed/bath upstairs;1/2 bath on main level Alternate Level Stairs-Number of Steps: full flight   Bathroom Shower/Tub: Producer, television/film/video: Standard     Home Equipment: Shower seat - built in          Prior Functioning/Environment Level of Independence: Independent        Comments: works for [employer]	[place]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        OT Problem List: Decreased knowledge  of use of DME or AE;Decreased knowledge of precautions;Decreased activity tolerance;Cardiopulmonary status limiting activity;Impaired balance (sitting and/or standing)      OT Treatment/Interventions: Self-care/ADL training;Therapeutic exercise;Patient/family education;Balance training;Energy conservation;Therapeutic activities;DME and/or AE instruction    OT Goals(Current goals can be found in the care plan section) Acute Rehab OT Goals Patient Stated Goal: return to independent baseline OT Goal Formulation: With patient Time For Goal Achievement: 11/21/20 Potential to Achieve Goals: Good ADL Goals Pt/caregiver will Perform Home Exercise Program: Increased strength;Both right and left upper extremity;With theraband;With written HEP provided Additional ADL Goal #1: Pt will recall and apply at least 3 energy conservation strategies to apply to ADL routine Additional ADL Goal #2: Pt will self monitor and maintain SpO2 >88% during ADL  OT Frequency: Min 3X/week   Barriers to D/C:            Co-evaluation              AM-PAC OT "6 Clicks" Daily Activity     Outcome Measure Help from another person eating meals?: None Help from another person taking care of personal grooming?: None Help from another person toileting, which includes using toliet, bedpan, or urinal?: A Little Help from another person bathing (including washing, rinsing, drying)?: A Little Help from another person to put on and taking off regular upper body clothing?: None Help from another person to put on and taking off regular lower body clothing?: None 6 Click Score: 22   End of Session Equipment Utilized During Treatment: Oxygen Nurse Communication: Mobility status  Activity Tolerance: Patient tolerated treatment well Patient left: in chair;with call bell/phone within reach  OT Visit Diagnosis: Other abnormalities of gait and mobility (R26.89)                Time: 4097-3532 OT Time Calculation (min): 31  min Charges:  OT General Charges $OT Visit: 1 Visit OT Evaluation $OT Eval Moderate Complexity: 1 Mod OT Treatments $Self Care/Home Management : 8-22 mins  [name], MSOT, OTR/L Acute Rehabilitation Services [facility] Office Number: [phone] Pager: [phone]  [name] 11/07/2020, 5:21 PM

## 2020-11-08 LAB — CBC WITH DIFFERENTIAL/PLATELET
Abs Immature Granulocytes: 0.14 10*3/uL — ABNORMAL HIGH (ref 0.00–0.07)
Basophils Absolute: 0 10*3/uL (ref 0.0–0.1)
Basophils Relative: 0 %
Eosinophils Absolute: 0 10*3/uL (ref 0.0–0.5)
Eosinophils Relative: 0 %
HCT: 46.2 % (ref 39.0–52.0)
Hemoglobin: 14.8 g/dL (ref 13.0–17.0)
Immature Granulocytes: 1 %
Lymphocytes Relative: 12 %
Lymphs Abs: 1.6 10*3/uL (ref 0.7–4.0)
MCH: 28 pg (ref 26.0–34.0)
MCHC: 32 g/dL (ref 30.0–36.0)
MCV: 87.3 fL (ref 80.0–100.0)
Monocytes Absolute: 0.8 10*3/uL (ref 0.1–1.0)
Monocytes Relative: 6 %
Neutro Abs: 10.9 10*3/uL — ABNORMAL HIGH (ref 1.7–7.7)
Neutrophils Relative %: 81 %
Platelets: 363 10*3/uL (ref 150–400)
RBC: 5.29 MIL/uL (ref 4.22–5.81)
RDW: 13.6 % (ref 11.5–15.5)
WBC: 13.4 10*3/uL — ABNORMAL HIGH (ref 4.0–10.5)
nRBC: 0 % (ref 0.0–0.2)

## 2020-11-08 LAB — GLUCOSE, CAPILLARY
Glucose-Capillary: 188 mg/dL — ABNORMAL HIGH (ref 70–99)
Glucose-Capillary: 270 mg/dL — ABNORMAL HIGH (ref 70–99)
Glucose-Capillary: 331 mg/dL — ABNORMAL HIGH (ref 70–99)
Glucose-Capillary: 298 mg/dL — ABNORMAL HIGH (ref 70–99)

## 2020-11-08 LAB — COMPREHENSIVE METABOLIC PANEL
ALT: 73 U/L — ABNORMAL HIGH (ref 0–44)
AST: 30 U/L (ref 15–41)
Albumin: 2.9 g/dL — ABNORMAL LOW (ref 3.5–5.0)
Alkaline Phosphatase: 46 U/L (ref 38–126)
Anion gap: 11 (ref 5–15)
BUN: 29 mg/dL — ABNORMAL HIGH (ref 6–20)
CO2: 26 mmol/L (ref 22–32)
Calcium: 9.1 mg/dL (ref 8.9–10.3)
Chloride: 107 mmol/L (ref 98–111)
Creatinine, Ser: 1.21 mg/dL (ref 0.61–1.24)
GFR, Estimated: >60 mL/min (ref >60)
Glucose, Bld: 167 mg/dL — ABNORMAL HIGH (ref 70–99)
Potassium: 4.5 mmol/L (ref 3.5–5.1)
Sodium: 144 mmol/L (ref 135–145)
Total Bilirubin: 0.7 mg/dL (ref 0.3–1.2)
Total Protein: 6.9 g/dL (ref 6.5–8.1)

## 2020-11-08 LAB — D-DIMER, QUANTITATIVE: D-Dimer, Quant: 0.5 ug/mL-FEU (ref 0.00–0.50)

## 2020-11-08 LAB — C-REACTIVE PROTEIN: CRP: 2.8 mg/dL — ABNORMAL HIGH (ref <1.0)

## 2020-11-08 MED ORDER — INSULIN GLARGINE 100 UNIT/ML ~~LOC~~ SOLN
18.0000 [IU] | Freq: Every day | SUBCUTANEOUS | Status: DC
  Administered 2020-11-08 – 2020-11-11 (×4): 18 [IU] via SUBCUTANEOUS
  Filled 2020-11-08 (×5): qty 0.18

## 2020-11-08 MED ORDER — INSULIN ASPART 100 UNIT/ML ~~LOC~~ SOLN
6.0000 [IU] | Freq: Three times a day (TID) | SUBCUTANEOUS | Status: DC
  Administered 2020-11-08 – 2020-11-09 (×5): 6 [IU] via SUBCUTANEOUS

## 2020-11-08 NOTE — Plan of Care (Signed)
  Problem: Coping: Goal: Psychosocial and spiritual needs will be supported Outcome: Progressing   Problem: Respiratory: Goal: Will maintain a patent airway Outcome: Progressing   Problem: Activity: Goal: Risk for activity intolerance will decrease Outcome: Progressing   Problem: Coping: Goal: Level of anxiety will decrease Outcome: Progressing

## 2020-11-08 NOTE — Progress Notes (Signed)
PROGRESS NOTE                                                                                                                                                                                                             Patient Demographics:    Benjamin Munoz, is a 59 y.o. male, DOB - 1961-08-17, ZOX:[id number]  Outpatient Primary MD for the patient is Pcp, No   Admit date - 11/04/2020   LOS - 4  Chief Complaint  Patient presents with  . Covid Positive  . Shortness of Breath  . Weakness       Brief Narrative: Patient is a 59 y.o. male with no significant PMHx-tested positive for COVID-19 on 12/5-presented to the ED with approximately 5-6-day history of worsening shortness of breath-found of acute hypoxic respiratory failure due to COVID-19 pneumonia.  COVID-19 vaccinated status: Unvaccinated  Significant Events: 12/8>> Admit to [facility] for severe hypoxemia due to COVID-19 pneumonia 12/9>> transitioned to heated high flow from salter high flow due to worsening hypoxemia  Significant studies: 12/9>> CTA chest: No PE, Covid pattern pneumonia. 12/10>> chest x-ray: Diffuse bilateral pulmonary infiltrates. 12/11>> chest x-ray: Stable bilateral lung opacities  COVID-19 medications: Steroids: 12/8>> Remdesivir: 12/8>> Baricitinib: 12/8>>  Antibiotics: Rocephin: 12/8 x 1 Zithromax: 12/8 x 1  Microbiology data: 12/8 >>blood culture: Negative  Procedures: None  Consults: None  DVT prophylaxis: Prophylactic Lovenox at twice daily dosing.    Subjective:   Remains unchanged-sitting at bedside chair-appears comfortable-remains on heated high flow.   Assessment  & Plan :   Acute Hypoxic Resp Failure due to Covid 19 Viral pneumonia: Continues to have severe hypoxemia-no significant improvement-no signs of volume overload-remains on steroid/Remdesivir and baricitinib.  Continue to mobilize with PT/OT-encourage  incentive spirometry and proning multiple hours during the daytime/night.    Fever: afebrile O2 requirements:  SpO2: 93 % O2 Flow Rate (L/min): 25 L/min FiO2 (%): 80 %   COVID-19 Labs: Recent Labs    11/06/20 0110 11/07/20 0240 11/08/20 0430  DDIMER 1.09* 0.85* 0.50  CRP 14.3* 6.1* 2.8*       Component Value Date/Time   BNP 44.4 11/04/2020 2050    Recent Labs  Lab 11/04/20 2050  PROCALCITON 0.46    Lab Results  Component Value Date   SARSCOV2NAA POSITIVE (A) 11/01/2020     Prone/Incentive Spirometry: encouraged  patient to lie prone for 3-4 hours at a time for a total of 16 hours a day, and to encourage incentive spirometry use 3-4/hour.  Transaminitis: Secondary to COVID-19-follow.  AKI: Mild-likely hemodynamically mediated-has resolved  Newly diagnosed DM-2 (A1c 7.1) with uncontrolled hyperglycemia due to steroid use: CBGs remain uncontrolled-increase Lantus to 18 units, increase Premeal NovoLog to 6 units, continue resistant SSI-follow and adjust. Plan to initiate Metformin when closer to discharge.    Recent Labs    11/07/20 2102 11/08/20 0749 11/08/20 1130  GLUCAP 322* 188* 270*    2 cm left thyroid nodule: Seen incidentally on CT chest-stable for outpatient follow-up with PCP.  TSH stable at 0.714.    Obesity Body mass index is 31.03 kg/m.  GI prophylaxis: PPI  ABG: No results found for: PHART, PCO2ART, PO2ART, HCO3, TCO2, ACIDBASEDEF, O2SAT  Vent Settings: N/A  Condition - Extremely Guarded  Family Communication  :  Spouse Frederik Schmidt(Rosalie [phone]) updated over the phone 12/12  Code Status :  Full Code  Diet :  Diet Order            Diet Heart Room service appropriate? Yes; Fluid consistency: Thin  Diet effective now                  Disposition Plan  :   Status is: Inpatient  Remains inpatient appropriate because:Inpatient level of care appropriate due to severity of illness   Dispo: The patient is from: Home               Anticipated d/c is to: Home              Anticipated d/c date is: > 3 days              Patient currently is not medically stable to d/c.    Barriers to discharge: Hypoxia requiring O2 supplementation/complete 5 days of IV Remdesivir  Antimicorbials  :    Anti-infectives (From admission, onward)   Start     Dose/Rate Route Frequency Ordered Stop   11/05/20 1000  remdesivir 100 mg in sodium chloride 0.9 % 100 mL IVPB       "Followed by" Linked Group Details   100 mg 200 mL/hr over 30 Minutes Intravenous Daily 11/04/20 2131 11/08/20 0928   11/05/20 0100  cefTRIAXone (ROCEPHIN) 2 g in sodium chloride 0.9 % 100 mL IVPB  Status:  Discontinued        2 g 200 mL/hr over 30 Minutes Intravenous Every 24 hours 11/05/20 0048 11/05/20 1222   11/05/20 0100  azithromycin (ZITHROMAX) 500 mg in sodium chloride 0.9 % 250 mL IVPB  Status:  Discontinued        500 mg 250 mL/hr over 60 Minutes Intravenous Every 24 hours 11/05/20 0048 11/05/20 1222   11/04/20 2200  remdesivir 200 mg in sodium chloride 0.9% 250 mL IVPB       "Followed by" Linked Group Details   200 mg 580 mL/hr over 30 Minutes Intravenous Once 11/04/20 2131 11/05/20 0049      Inpatient Medications  Scheduled Meds: . baricitinib  4 mg Oral Daily  . benzonatate  200 mg Oral TID  . enoxaparin (LOVENOX) injection  40 mg Subcutaneous BID  . influenza vac split quadrivalent PF  0.5 mL Intramuscular Tomorrow-1000  . insulin aspart  0-20 Units Subcutaneous TID WC  . insulin aspart  0-5 Units Subcutaneous QHS  . insulin aspart  6 Units Subcutaneous TID WC  . insulin glargine  18 Units Subcutaneous Daily  . methylPREDNISolone (SOLU-MEDROL) injection  1 mg/kg Intravenous Q12H  . pantoprazole  40 mg Oral Q1200   Continuous Infusions:  PRN Meds:.acetaminophen, albuterol, alum & mag hydroxide-simeth, chlorpheniramine-HYDROcodone, clonazepam, guaiFENesin-dextromethorphan, ondansetron **OR** ondansetron (ZOFRAN) IV   Time Spent in minutes   35    See all Orders from today for further details   Jeoffrey Massed M.D on 11/08/2020 at 2:44 PM  To page go to [email or web address] - use universal password  [facility] -  Office  [phone]    Objective:   Vitals:   11/08/20 0900 11/08/20 1110 11/08/20 1125 11/08/20 1213  BP:   140/78   Pulse:   90   Resp:   (!) 21   Temp:   97.9 F (36.6 C)   TempSrc:   Oral   SpO2: 91% 90% 91% 93%  Weight:      Height:        Wt Readings from Last 3 Encounters:  11/04/20 95.3 kg  11/01/20 95.3 kg  01/09/18 97.5 kg     Intake/Output Summary (Last 24 hours) at 11/08/2020 1444 Last data filed at 11/08/2020 5003 Gross per 24 hour  Intake 240 ml  Output 950 ml  Net -710 ml     Physical Exam Gen Exam:Alert awake-not in any distress HEENT:atraumatic, normocephalic Chest: B/L clear to auscultation anteriorly CVS:S1S2 regular Abdomen:soft non tender, non distended Extremities:no edema Neurology: Non focal Skin: no rash  Data Review:    CBC Recent Labs  Lab 11/04/20 2050 11/05/20 0640 11/06/20 0110 11/07/20 0240 11/08/20 0430  WBC 10.6* 7.6 11.3* 14.7* 13.4*  HGB 14.8 14.3 15.0 15.2 14.8  HCT 45.8 42.7 45.7 47.0 46.2  PLT 221 234 308 275 363  MCV 87.4 86.6 86.2 87.5 87.3  MCH 28.2 29.0 28.3 28.3 28.0  MCHC 32.3 33.5 32.8 32.3 32.0  RDW 13.7 13.9 13.7 13.9 13.6  LYMPHSABS 2.0 1.2 3.0 2.1 1.6  MONOABS 0.6 0.1 0.6 0.7 0.8  EOSABS 0.0 0.0 0.0 0.0 0.0  BASOSABS 0.0 0.0 0.0 0.0 0.0    Chemistries  Recent Labs  Lab 11/04/20 2050 11/05/20 0640 11/06/20 0110 11/07/20 0240 11/08/20 0430  NA 133* 138 140 142 144  K 4.5 4.1 3.9 4.3 4.5  CL 100 101 104 104 107  CO2 20* 22 22 25 26   GLUCOSE 125* 193* 191* 256* 167*  BUN 14 17 23* 28* 29*  CREATININE 1.20 1.22 1.25* 1.37* 1.21  CALCIUM 8.2* 8.6* 9.1 9.3 9.1  AST 123* 116* 97* 48* 30  ALT 84* 99* 117* 94* 73*  ALKPHOS 47 44 51 49 46  BILITOT 0.8 0.7 0.5 0.3 0.7    ------------------------------------------------------------------------------------------------------------------ No results for input(s): CHOL, HDL, LDLCALC, TRIG, CHOLHDL, LDLDIRECT in the last 72 hours.  Lab Results  Component Value Date   HGBA1C 7.1 (H) 11/06/2020   ------------------------------------------------------------------------------------------------------------------ Recent Labs    11/06/20 0110  TSH 0.714   ------------------------------------------------------------------------------------------------------------------ No results for input(s): VITAMINB12, FOLATE, FERRITIN, TIBC, IRON, RETICCTPCT in the last 72 hours.  Coagulation profile No results for input(s): INR, PROTIME in the last 168 hours.  Recent Labs    11/07/20 0240 11/08/20 0430  DDIMER 0.85* 0.50    Cardiac Enzymes No results for input(s): CKMB, TROPONINI, MYOGLOBIN in the last 168 hours.  Invalid input(s): CK ------------------------------------------------------------------------------------------------------------------    Component Value Date/Time   BNP 44.4 11/04/2020 2050    Micro Results Recent Results (from the past 240 hour(s))  Resp Panel by RT-PCR (Flu  A&B, Covid) Nasopharyngeal Swab     Status: Abnormal   Collection Time: 11/01/20 10:02 AM   Specimen: Nasopharyngeal Swab; Nasopharyngeal(NP) swabs in vial transport medium  Result Value Ref Range Status   SARS Coronavirus 2 by RT PCR POSITIVE (A) NEGATIVE Final    Comment: RESULT CALLED TO, READ BACK BY AND VERIFIED WITH: EVeva Holes RN, ST 1124 11/01/20 D. VANHOOK (NOTE) SARS-CoV-2 target nucleic acids are DETECTED.  The SARS-CoV-2 RNA is generally detectable in upper respiratory specimens during the acute phase of infection. Positive results are indicative of the presence of the identified virus, but do not rule out bacterial infection or co-infection with other pathogens not detected by the test. Clinical correlation  with patient history and other diagnostic information is necessary to determine patient infection status. The expected result is Negative.  Fact Sheet for Patients: BloggerCourse.com  Fact Sheet for Healthcare Providers: SeriousBroker.it  This test is not yet approved or cleared by the [place] FDA and  has been authorized for detection and/or diagnosis of SARS-CoV-2 by FDA under an Emergency Use Authorization (EUA).  This EUA will remain in effect (meaning this tes t can be used) for the duration of  the COVID-19 declaration under Section 564(b)(1) of the Act, 21 U.S.C. section 360bbb-3(b)(1), unless the authorization is terminated or revoked sooner.     Influenza A by PCR NEGATIVE NEGATIVE Final   Influenza B by PCR NEGATIVE NEGATIVE Final    Comment: (NOTE) The Xpert Xpress SARS-CoV-2/FLU/RSV plus assay is intended as an aid in the diagnosis of influenza from Nasopharyngeal swab specimens and should not be used as a sole basis for treatment. Nasal washings and aspirates are unacceptable for Xpert Xpress SARS-CoV-2/FLU/RSV testing.  Fact Sheet for Patients: BloggerCourse.com  Fact Sheet for Healthcare Providers: SeriousBroker.it  This test is not yet approved or cleared by the [place] FDA and has been authorized for detection and/or diagnosis of SARS-CoV-2 by FDA under an Emergency Use Authorization (EUA). This EUA will remain in effect (meaning this test can be used) for the duration of the COVID-19 declaration under Section 564(b)(1) of the Act, 21 U.S.C. section 360bbb-3(b)(1), unless the authorization is terminated or revoked.  Performed at [facility], [address], [place], [place] [address]   Blood Culture (routine x 2)     Status: None (Preliminary result)   Collection Time: 11/04/20  8:52 PM   Specimen: BLOOD  Result Value Ref Range Status    Specimen Description BLOOD LEFT ANTECUBITAL  Final   Special Requests   Final    BOTTLES DRAWN AEROBIC AND ANAEROBIC Blood Culture results may not be optimal due to an inadequate volume of blood received in culture bottles   Culture   Final    NO GROWTH 4 DAYS Performed at [facility], [address] [place], [place] [address]    Report Status PENDING  Incomplete  Blood Culture (routine x 2)     Status: None (Preliminary result)   Collection Time: 11/04/20  8:55 PM   Specimen: BLOOD  Result Value Ref Range Status   Specimen Description BLOOD RIGHT ANTECUBITAL  Final   Special Requests   Final    BOTTLES DRAWN AEROBIC AND ANAEROBIC Blood Culture adequate volume   Culture   Final    NO GROWTH 4 DAYS Performed at [facility], [address], [place], [place] [address]    Report Status PENDING  Incomplete    Radiology Reports CT Angio  Chest PE W and/or Wo Contrast  Result Date: 11/05/2020 CLINICAL DATA:  COVID positive.  Shortness of breath EXAM: CT ANGIOGRAPHY CHEST WITH CONTRAST TECHNIQUE: Multidetector CT imaging of the chest was performed using the standard protocol during bolus administration of intravenous contrast. Multiplanar CT image reconstructions and MIPs were obtained to evaluate the vascular anatomy. CONTRAST:  OMNIPAQUE IOHEXOL 350 MG/ML SOLN COMPARISON:  Chest x-ray from yesterday and 4 days ago FINDINGS: Cardiovascular: Satisfactory opacification of the pulmonary arteries to the segmental level. No evidence of pulmonary embolism when allowing for levels of motion artifact. Normal heart size. No pericardial effusion. Mediastinum/Nodes: 2 cm left thyroid nodule. No adenopathy in the mediastinum. Lungs/Pleura: Patchy ground-glass opacity throughout all lobes with intervening normal lung. Lung volumes are low. No visible effusion or pneumothorax. Upper Abdomen: There could be hepatic steatosis, but certainty is limited by arterial timing. Musculoskeletal:  Spondylosis. Review of the MIP images confirms the above findings. IMPRESSION: 1. COVID pattern pneumonia. 2. Motion degraded chest CTA with no evidence of pulmonary embolism. 3. 2 cm left thyroid nodule. Recommend thyroid US.(Ref: J Am Coll Radiol. 2015 Feb;12(2): 143-50). Electronically Signed   By: Marnee Spring M.D.   On: 11/05/2020 05:09   DG Chest Port 1 View  Result Date: 11/06/2020 CLINICAL DATA:  Shortness of breath.  COVID. EXAM: PORTABLE CHEST 1 VIEW COMPARISON:  CT 11/05/2020.  Chest x-ray 11/04/2020. FINDINGS: Heart size stable. Diffuse bilateral pulmonary infiltrates/edema. Similar findings noted on prior exam. No pleural effusion or pneumothorax. Degenerative change thoracic spine. IMPRESSION: Diffuse bilateral pulmonary infiltrates/edema. Similar findings on prior exam. Findings consistent with COVID pneumonia. Electronically Signed   By: Maisie Fus  Register   On: 11/06/2020 08:01   DG Chest Port 1 View  Result Date: 11/04/2020 CLINICAL DATA:  59 year old male positive COVID-19.  Hypoxia. EXAM: PORTABLE CHEST 1 VIEW COMPARISON:  Portable chest 11/01/2020. FINDINGS: Portable AP semi upright view at 2016 hours. Mildly lower lung volumes and moderately progressed Patchy and confluent bilateral mid and lower lung opacity. Opacity is somewhat more extensive on the right now including some right upper lung involvement. The left upper lung remains spared. No pneumothorax. No pleural effusion is evident. Stable cardiac size and mediastinal contours. Paucity of bowel gas in the upper abdomen No acute osseous abnormality identified. Visualized tracheal air column is within normal limits. IMPRESSION: Progressed bilateral COVID-19 pneumonia. Electronically Signed   By: Odessa Fleming M.D.   On: 11/04/2020 20:34   DG Chest Portable 1 View  Result Date: 11/01/2020 CLINICAL DATA:  Fever, chills, body aches. EXAM: PORTABLE CHEST 1 VIEW COMPARISON:  None. FINDINGS: Borderline cardiomegaly. Subtle patchy  ground-glass opacities bilaterally. No pleural effusion or pneumothorax is seen. Osseous structures about the chest are unremarkable. IMPRESSION: 1. Subtle patchy ground-glass opacities bilaterally, suspicious for multifocal pneumonia. 2. Borderline cardiomegaly. Electronically Signed   By: Bary Bonny M.D.   On: 11/01/2020 10:33   DG Chest Port 1V same Day  Result Date: 11/07/2020 CLINICAL DATA:  Shortness of breath. EXAM: PORTABLE CHEST 1 VIEW COMPARISON:  November 06, 2020. FINDINGS: Stable cardiomediastinal silhouette. No pneumothorax or pleural effusion is noted. Stable bilateral lung opacities are noted consistent with multifocal pneumonia. Bony thorax is unremarkable. IMPRESSION: Stable bilateral lung opacities consistent with multifocal pneumonia. Electronically Signed   By: Lupita Raider M.D.   On: 11/07/2020 16:16

## 2020-11-09 ENCOUNTER — Inpatient Hospital Stay (HOSPITAL_COMMUNITY): Payer: BLUE CROSS/BLUE SHIELD

## 2020-11-09 DIAGNOSIS — R7989 Other specified abnormal findings of blood chemistry: Secondary | ICD-10-CM

## 2020-11-09 DIAGNOSIS — U071 COVID-19: Secondary | ICD-10-CM

## 2020-11-09 LAB — CBC WITH DIFFERENTIAL/PLATELET
Abs Immature Granulocytes: 0.28 10*3/uL — ABNORMAL HIGH (ref 0.00–0.07)
Basophils Absolute: 0.1 10*3/uL (ref 0.0–0.1)
Basophils Relative: 1 %
Eosinophils Absolute: 0 10*3/uL (ref 0.0–0.5)
Eosinophils Relative: 0 %
HCT: 44.9 % (ref 39.0–52.0)
Hemoglobin: 14.8 g/dL (ref 13.0–17.0)
Immature Granulocytes: 2 %
Lymphocytes Relative: 10 %
Lymphs Abs: 1.4 10*3/uL (ref 0.7–4.0)
MCH: 29.4 pg (ref 26.0–34.0)
MCHC: 33 g/dL (ref 30.0–36.0)
MCV: 89.3 fL (ref 80.0–100.0)
Monocytes Absolute: 0.8 10*3/uL (ref 0.1–1.0)
Monocytes Relative: 6 %
Neutro Abs: 11 10*3/uL — ABNORMAL HIGH (ref 1.7–7.7)
Neutrophils Relative %: 81 %
Platelets: 262 10*3/uL (ref 150–400)
RBC: 5.03 MIL/uL (ref 4.22–5.81)
RDW: 13.8 % (ref 11.5–15.5)
WBC: 13.6 10*3/uL — ABNORMAL HIGH (ref 4.0–10.5)
nRBC: 0 % (ref 0.0–0.2)

## 2020-11-09 LAB — GLUCOSE, CAPILLARY
Glucose-Capillary: 176 mg/dL — ABNORMAL HIGH (ref 70–99)
Glucose-Capillary: 299 mg/dL — ABNORMAL HIGH (ref 70–99)
Glucose-Capillary: 316 mg/dL — ABNORMAL HIGH (ref 70–99)
Glucose-Capillary: 206 mg/dL — ABNORMAL HIGH (ref 70–99)

## 2020-11-09 LAB — COMPREHENSIVE METABOLIC PANEL
ALT: 62 U/L — ABNORMAL HIGH (ref 0–44)
AST: 27 U/L (ref 15–41)
Albumin: 2.8 g/dL — ABNORMAL LOW (ref 3.5–5.0)
Alkaline Phosphatase: 52 U/L (ref 38–126)
Anion gap: 9 (ref 5–15)
BUN: 28 mg/dL — ABNORMAL HIGH (ref 6–20)
CO2: 25 mmol/L (ref 22–32)
Calcium: 8.9 mg/dL (ref 8.9–10.3)
Chloride: 104 mmol/L (ref 98–111)
Creatinine, Ser: 1.21 mg/dL (ref 0.61–1.24)
GFR, Estimated: >60 mL/min (ref >60)
Glucose, Bld: 200 mg/dL — ABNORMAL HIGH (ref 70–99)
Potassium: 4.3 mmol/L (ref 3.5–5.1)
Sodium: 138 mmol/L (ref 135–145)
Total Bilirubin: 0.8 mg/dL (ref 0.3–1.2)
Total Protein: 6.7 g/dL (ref 6.5–8.1)

## 2020-11-09 LAB — CULTURE, BLOOD (ROUTINE X 2)
Culture: NO GROWTH
Culture: NO GROWTH
Special Requests: ADEQUATE

## 2020-11-09 LAB — C-REACTIVE PROTEIN: CRP: 1.1 mg/dL — ABNORMAL HIGH (ref <1.0)

## 2020-11-09 LAB — D-DIMER, QUANTITATIVE: D-Dimer, Quant: >20 ug/mL-FEU — ABNORMAL HIGH (ref 0.00–0.50)

## 2020-11-09 MED ORDER — ENOXAPARIN SODIUM 100 MG/ML ~~LOC~~ SOLN: 95.0000 mg | Freq: Two times a day (BID) | SUBCUTANEOUS | Status: DC

## 2020-11-09 MED ORDER — INSULIN ASPART 100 UNIT/ML ~~LOC~~ SOLN
10.0000 [IU] | Freq: Three times a day (TID) | SUBCUTANEOUS | Status: DC
  Administered 2020-11-09 – 2020-11-10 (×3): 10 [IU] via SUBCUTANEOUS

## 2020-11-09 MED ORDER — ENOXAPARIN SODIUM 60 MG/0.6ML ~~LOC~~ SOLN
55.0000 mg | Freq: Once | SUBCUTANEOUS | Status: AC
  Administered 2020-11-09: 12:00:00 55 mg via SUBCUTANEOUS
  Filled 2020-11-09: qty 0.6

## 2020-11-09 MED ORDER — IOHEXOL 350 MG/ML SOLN
100.0000 mL | Freq: Once | INTRAVENOUS | Status: AC | PRN
  Administered 2020-11-09: 23:00:00 100 mL via INTRAVENOUS

## 2020-11-09 MED ORDER — ENOXAPARIN SODIUM 100 MG/ML ~~LOC~~ SOLN
95.0000 mg | Freq: Two times a day (BID) | SUBCUTANEOUS | Status: DC
  Administered 2020-11-09 – 2020-11-10 (×2): 95 mg via SUBCUTANEOUS
  Filled 2020-11-09 (×2): qty 1

## 2020-11-09 NOTE — Progress Notes (Signed)
ANTICOAGULATION CONSULT NOTE - Follow Up Consult  Pharmacy Consult for Lovenox Indication: rule out VTE / D - dimer > 20 / Covid  No Known Allergies  Vital Signs: Temp: 98 F (36.7 C) (12/13 0747) Temp Source: Oral (12/13 0747) BP: 134/82 (12/13 0747) Pulse Rate: 92 (12/13 0938)  Labs: Recent Labs    11/07/20 0240 11/08/20 0430 11/09/20 0050 11/09/20 0749  HGB 15.2 14.8 14.8  --   HCT 47.0 46.2 44.9  --   PLT 275 363 262  --   CREATININE 1.37* 1.21  --  1.21    Estimated Creatinine Clearance: 74.8 mL/min (by C-G formula based on SCr of 1.21 mg/dL).  Assessment: 59 year old male with COVID and D-dimer > 20 with high oxygen requirement to begin Lovenox at treatment dose for VTE rule out  Previously on Lovenox 40 mg sq Q 12 - last dose this AM  Goal of Therapy:  Anti-Xa level 0.6-1 units/ml 4hrs after LMWH dose given Monitor platelets by anticoagulation protocol: Yes   Plan:  Lovenox 55 mg sq x 1 dose now to make 95 mg dose for AM Lovenox 95 mg sq Q 12 hours starting tonight Watch CBC  Thank you Okey Regal, PharmD  11/09/2020,9:43 AM

## 2020-11-09 NOTE — Progress Notes (Signed)
Inpatient Diabetes Program Recommendations  AACE/ADA: New Consensus Statement on Inpatient Glycemic Control (2015)  Target Ranges:  Prepandial:   less than 140 mg/dL      Peak postprandial:   less than 180 mg/dL (1-2 hours)      Critically ill patients:  140 - 180 mg/dL   Lab Results  Component Value Date   GLUCAP 299 (H) 11/09/2020   HGBA1C 7.1 (H) 11/06/2020    Review of Glycemic Control Results for Benjamin Munoz, Benjamin Munoz (MRN [id number]) as of 11/09/2020 14:53  Ref. Range 11/08/2020 11:30 11/08/2020 17:14 11/08/2020 21:43 11/09/2020 07:47 11/09/2020 11:43  Glucose-Capillary Latest Ref Range: 70 - 99 mg/dL 673 (H) 419 (H) 379 (H) 176 (H) 299 (H)   Diabetes history: No prior hx  Outpatient Diabetes medications: None Current orders for Inpatient glycemic control: Lantus 18 units qd + Novolog 6 units tid meal coverage + Novolog 0-20 units correction + hs 0-5   Inpatient Diabetes Program Recommendations:   While on steroids: -Increase Novolog meal coverage to 10 units tid if eats 50% -Tradjenta 5 mg qd Secure chat sent to Dr. Jerral Ralph.  Thank you, Billy Fischer. Shannette Tabares, RN, MSN, CDE  Diabetes Coordinator Inpatient Glycemic Control Team Team Pager [phone] (8am-5pm) 11/09/2020 2:54 PM

## 2020-11-09 NOTE — Progress Notes (Signed)
Bilateral lower extremity venous study completed.      Please see CV Proc for preliminary results.   Elmire Amrein, RVT  

## 2020-11-09 NOTE — Progress Notes (Signed)
PROGRESS NOTE                                                                                                                                                                                                             Patient Demographics:    Benjamin Munoz, is a 59 y.o. male, DOB - 11-11-1961, ZOX:[id number]  Outpatient Primary MD for the patient is Pcp, No   Admit date - 11/04/2020   LOS - 5  Chief Complaint  Patient presents with  . Covid Positive  . Shortness of Breath  . Weakness       Brief Narrative: Patient is a 59 y.o. male with no significant PMHx-tested positive for COVID-19 on 12/5-presented to the ED with approximately 5-6-day history of worsening shortness of breath-found of acute hypoxic respiratory failure due to COVID-19 pneumonia.  COVID-19 vaccinated status: Unvaccinated  Significant Events: 12/8>> Admit to [facility] for severe hypoxemia due to COVID-19 pneumonia 12/9>> transitioned to heated high flow from salter high flow due to worsening hypoxemia  Significant studies: 12/9>> CTA chest: No PE, Covid pattern pneumonia. 12/10>> chest x-ray: Diffuse bilateral pulmonary infiltrates. 12/11>> chest x-ray: Stable bilateral lung opacities  COVID-19 medications: Steroids: 12/8>> Remdesivir: 12/8>> Baricitinib: 12/8>>  Antibiotics: Rocephin: 12/8 x 1 Zithromax: 12/8 x 1  Microbiology data: 12/8 >>blood culture: Negative  Procedures: None  Consults: None  DVT prophylaxis: Therapeutic Lovenox.    Subjective:   Remains unchanged-on heated high flow.   Assessment  & Plan :   Acute Hypoxic Resp Failure due to Covid 19 Viral pneumonia: Continues to have severe hypoxemia-no signs of volume overload-remains on steroids/baricitinib-has finished a course of Remdesivir.  Significant jump in D-dimer today-check CTA chest and lower extremity Doppler-switch to therapeutic Lovenox. Continue to  mobilize with PT/OT-encourage incentive spirometry and proning multiple hours during the daytime/night.    Fever: afebrile O2 requirements:  SpO2: 92 % O2 Flow Rate (L/min): 25 L/min FiO2 (%): 70 %   COVID-19 Labs: Recent Labs    11/07/20 0240 11/08/20 0430 11/09/20 0050 11/09/20 0749  DDIMER 0.85* 0.50 >20.00*  --   CRP 6.1* 2.8*  --  1.1*       Component Value Date/Time   BNP 44.4 11/04/2020 2050    Recent Labs  Lab 11/04/20 2050  PROCALCITON 0.46    Lab Results  Component Value  Date   SARSCOV2NAA POSITIVE (A) 11/01/2020     Prone/Incentive Spirometry: encouraged patient to lie prone for 3-4 hours at a time for a total of 16 hours a day, and to encourage incentive spirometry use 3-4/hour.  Transaminitis: Secondary to COVID-19-follow.  AKI: Mild-likely hemodynamically mediated-has resolved  Newly diagnosed DM-2 (A1c 7.1) with uncontrolled hyperglycemia due to steroid use: CBGs remain uncontrolled-18 units, increase Premeal NovoLog to 10 units, continue resistant SSI.  Plan to initiate Metformin when closer to discharge.    Recent Labs    11/08/20 2143 11/09/20 0747 11/09/20 1143  GLUCAP 298* 176* 299*    2 cm left thyroid nodule: Seen incidentally on CT chest-stable for outpatient follow-up with PCP.  TSH stable at 0.714.    Obesity Body mass index is 31.03 kg/m.  GI prophylaxis: PPI  ABG: No results found for: PHART, PCO2ART, PO2ART, HCO3, TCO2, ACIDBASEDEF, O2SAT  Vent Settings: N/A  Condition - Extremely Guarded  Family Communication  :  Spouse Frederik Schmidt [phone]) updated over the phone 12/13  Code Status :  Full Code  Diet :  Diet Order            Diet Heart Room service appropriate? Yes; Fluid consistency: Thin  Diet effective now                  Disposition Plan  :   Status is: Inpatient  Remains inpatient appropriate because:Inpatient level of care appropriate due to severity of illness   Dispo: The patient is from:  Home              Anticipated d/c is to: Home              Anticipated d/c date is: > 3 days              Patient currently is not medically stable to d/c.    Barriers to discharge: Hypoxia requiring O2 supplementation/complete 5 days of IV Remdesivir  Antimicorbials  :    Anti-infectives (From admission, onward)   Start     Dose/Rate Route Frequency Ordered Stop   11/05/20 1000  remdesivir 100 mg in sodium chloride 0.9 % 100 mL IVPB       "Followed by" Linked Group Details   100 mg 200 mL/hr over 30 Minutes Intravenous Daily 11/04/20 2131 11/08/20 0928   11/05/20 0100  cefTRIAXone (ROCEPHIN) 2 g in sodium chloride 0.9 % 100 mL IVPB  Status:  Discontinued        2 g 200 mL/hr over 30 Minutes Intravenous Every 24 hours 11/05/20 0048 11/05/20 1222   11/05/20 0100  azithromycin (ZITHROMAX) 500 mg in sodium chloride 0.9 % 250 mL IVPB  Status:  Discontinued        500 mg 250 mL/hr over 60 Minutes Intravenous Every 24 hours 11/05/20 0048 11/05/20 1222   11/04/20 2200  remdesivir 200 mg in sodium chloride 0.9% 250 mL IVPB       "Followed by" Linked Group Details   200 mg 580 mL/hr over 30 Minutes Intravenous Once 11/04/20 2131 11/05/20 0049      Inpatient Medications  Scheduled Meds: . baricitinib  4 mg Oral Daily  . benzonatate  200 mg Oral TID  . enoxaparin (LOVENOX) injection  95 mg Subcutaneous Q12H  . influenza vac split quadrivalent PF  0.5 mL Intramuscular Tomorrow-1000  . insulin aspart  0-20 Units Subcutaneous TID WC  . insulin aspart  0-5 Units Subcutaneous QHS  . insulin aspart  10 Units Subcutaneous TID WC  . insulin glargine  18 Units Subcutaneous Daily  . methylPREDNISolone (SOLU-MEDROL) injection  1 mg/kg Intravenous Q12H  . pantoprazole  40 mg Oral Q1200   Continuous Infusions:  PRN Meds:.acetaminophen, albuterol, alum & mag hydroxide-simeth, chlorpheniramine-HYDROcodone, clonazepam, guaiFENesin-dextromethorphan, ondansetron **OR** ondansetron (ZOFRAN) IV    Time Spent in minutes  35    See all Orders from today for further details   Jeoffrey Massed M.D on 11/09/2020 at 3:22 PM  To page go to [email or web address] - use universal password  [facility] -  Office  [phone]    Objective:   Vitals:   11/09/20 0518 11/09/20 0747 11/09/20 0938 11/09/20 1144  BP: 130/72 134/82  128/79  Pulse: 95 80 92 95  Resp: 18 18 (!) 22 19  Temp: 98.2 F (36.8 C) 98 F (36.7 C)  97.8 F (36.6 C)  TempSrc: Oral Oral  Oral  SpO2: 92% 92% 91% 92%  Weight:      Height:        Wt Readings from Last 3 Encounters:  11/04/20 95.3 kg  11/01/20 95.3 kg  01/09/18 97.5 kg     Intake/Output Summary (Last 24 hours) at 11/09/2020 1522 Last data filed at 11/09/2020 1419 Gross per 24 hour  Intake 480 ml  Output 700 ml  Net -220 ml     Physical Exam Gen Exam:Alert awake-not in any distress HEENT:atraumatic, normocephalic Chest: B/L clear to auscultation anteriorly CVS:S1S2 regular Abdomen:soft non tender, non distended Extremities:no edema Neurology: Non focal Skin: no rash   Data Review:    CBC Recent Labs  Lab 11/05/20 0640 11/06/20 0110 11/07/20 0240 11/08/20 0430 11/09/20 0050  WBC 7.6 11.3* 14.7* 13.4* 13.6*  HGB 14.3 15.0 15.2 14.8 14.8  HCT 42.7 45.7 47.0 46.2 44.9  PLT 234 308 275 363 262  MCV 86.6 86.2 87.5 87.3 89.3  MCH 29.0 28.3 28.3 28.0 29.4  MCHC 33.5 32.8 32.3 32.0 33.0  RDW 13.9 13.7 13.9 13.6 13.8  LYMPHSABS 1.2 3.0 2.1 1.6 1.4  MONOABS 0.1 0.6 0.7 0.8 0.8  EOSABS 0.0 0.0 0.0 0.0 0.0  BASOSABS 0.0 0.0 0.0 0.0 0.1    Chemistries  Recent Labs  Lab 11/05/20 0640 11/06/20 0110 11/07/20 0240 11/08/20 0430 11/09/20 0749  NA 138 140 142 144 138  K 4.1 3.9 4.3 4.5 4.3  CL 101 104 104 107 104  CO2 22 22 25 26 25   GLUCOSE 193* 191* 256* 167* 200*  BUN 17 23* 28* 29* 28*  CREATININE 1.22 1.25* 1.37* 1.21 1.21  CALCIUM 8.6* 9.1 9.3 9.1 8.9  AST 116* 97* 48* 30 27  ALT 99* 117* 94* 73* 62*  ALKPHOS 44  51 49 46 52  BILITOT 0.7 0.5 0.3 0.7 0.8   ------------------------------------------------------------------------------------------------------------------ No results for input(s): CHOL, HDL, LDLCALC, TRIG, CHOLHDL, LDLDIRECT in the last 72 hours.  Lab Results  Component Value Date   HGBA1C 7.1 (H) 11/06/2020   ------------------------------------------------------------------------------------------------------------------ No results for input(s): TSH, T4TOTAL, T3FREE, THYROIDAB in the last 72 hours.  Invalid input(s): FREET3 ------------------------------------------------------------------------------------------------------------------ No results for input(s): VITAMINB12, FOLATE, FERRITIN, TIBC, IRON, RETICCTPCT in the last 72 hours.  Coagulation profile No results for input(s): INR, PROTIME in the last 168 hours.  Recent Labs    11/08/20 0430 11/09/20 0050  DDIMER 0.50 >20.00*    Cardiac Enzymes No results for input(s): CKMB, TROPONINI, MYOGLOBIN in the last 168 hours.  Invalid input(s): CK ------------------------------------------------------------------------------------------------------------------    Component Value Date/Time  BNP 44.4 11/04/2020 2050    Micro Results Recent Results (from the past 240 hour(s))  Resp Panel by RT-PCR (Flu A&B, Covid) Nasopharyngeal Swab     Status: Abnormal   Collection Time: 11/01/20 10:02 AM   Specimen: Nasopharyngeal Swab; Nasopharyngeal(NP) swabs in vial transport medium  Result Value Ref Range Status   SARS Coronavirus 2 by RT PCR POSITIVE (A) NEGATIVE Final    Comment: RESULT CALLED TO, READ BACK BY AND VERIFIED WITH: EVeva Holes. VAZQUEZ RN, ST 1124 11/01/20 D. VANHOOK (NOTE) SARS-CoV-2 target nucleic acids are DETECTED.  The SARS-CoV-2 RNA is generally detectable in upper respiratory specimens during the acute phase of infection. Positive results are indicative of the presence of the identified virus, but do not rule out  bacterial infection or co-infection with other pathogens not detected by the test. Clinical correlation with patient history and other diagnostic information is necessary to determine patient infection status. The expected result is Negative.  Fact Sheet for Patients: BloggerCourse.comhttps://www.fda.gov/media/152166/download  Fact Sheet for Healthcare Providers: SeriousBroker.ithttps://www.fda.gov/media/152162/download  This test is not yet approved or cleared by the [place] FDA and  has been authorized for detection and/or diagnosis of SARS-CoV-2 by FDA under an Emergency Use Authorization (EUA).  This EUA will remain in effect (meaning this tes t can be used) for the duration of  the COVID-19 declaration under Section 564(b)(1) of the Act, 21 U.S.C. section 360bbb-3(b)(1), unless the authorization is terminated or revoked sooner.     Influenza A by PCR NEGATIVE NEGATIVE Final   Influenza B by PCR NEGATIVE NEGATIVE Final    Comment: (NOTE) The Xpert Xpress SARS-CoV-2/FLU/RSV plus assay is intended as an aid in the diagnosis of influenza from Nasopharyngeal swab specimens and should not be used as a sole basis for treatment. Nasal washings and aspirates are unacceptable for Xpert Xpress SARS-CoV-2/FLU/RSV testing.  Fact Sheet for Patients: BloggerCourse.comhttps://www.fda.gov/media/152166/download  Fact Sheet for Healthcare Providers: SeriousBroker.ithttps://www.fda.gov/media/152162/download  This test is not yet approved or cleared by the [place] FDA and has been authorized for detection and/or diagnosis of SARS-CoV-2 by FDA under an Emergency Use Authorization (EUA). This EUA will remain in effect (meaning this test can be used) for the duration of the COVID-19 declaration under Section 564(b)(1) of the Act, 21 U.S.C. section 360bbb-3(b)(1), unless the authorization is terminated or revoked.  Performed at [facility], [address] [place], [place] [address]   Blood Culture (routine x 2)     Status: None    Collection Time: 11/04/20  8:52 PM   Specimen: BLOOD  Result Value Ref Range Status   Specimen Description BLOOD LEFT ANTECUBITAL  Final   Special Requests   Final    BOTTLES DRAWN AEROBIC AND ANAEROBIC Blood Culture results may not be optimal due to an inadequate volume of blood received in culture bottles   Culture   Final    NO GROWTH 5 DAYS Performed at [facility], [address] [place], [place] [address]    Report Status 11/09/2020 FINAL  Final  Blood Culture (routine x 2)     Status: None   Collection Time: 11/04/20  8:55 PM   Specimen: BLOOD  Result Value Ref Range Status   Specimen Description BLOOD RIGHT ANTECUBITAL  Final   Special Requests   Final    BOTTLES DRAWN AEROBIC AND ANAEROBIC Blood Culture adequate volume   Culture   Final    NO GROWTH 5 DAYS Performed at [facility], [address], [place], [place] [address]    Report Status 11/09/2020 FINAL  Final    Radiology Reports CT Angio Chest PE W and/or Wo Contrast  Result Date: 11/05/2020 CLINICAL DATA:  COVID positive.  Shortness of breath EXAM: CT ANGIOGRAPHY CHEST WITH CONTRAST TECHNIQUE: Multidetector CT imaging of the chest was performed using the standard protocol during bolus administration of intravenous contrast. Multiplanar CT image reconstructions and MIPs were obtained to evaluate the vascular anatomy. CONTRAST:  OMNIPAQUE IOHEXOL 350 MG/ML SOLN COMPARISON:  Chest x-ray from yesterday and 4 days ago FINDINGS: Cardiovascular: Satisfactory opacification of the pulmonary arteries to the segmental level. No evidence of pulmonary embolism when allowing for levels of motion artifact. Normal heart size. No pericardial effusion. Mediastinum/Nodes: 2 cm left thyroid nodule. No adenopathy in the mediastinum. Lungs/Pleura: Patchy ground-glass opacity throughout all lobes with intervening normal lung. Lung volumes are low. No visible effusion or pneumothorax. Upper Abdomen: There could be hepatic  steatosis, but certainty is limited by arterial timing. Musculoskeletal: Spondylosis. Review of the MIP images confirms the above findings. IMPRESSION: 1. COVID pattern pneumonia. 2. Motion degraded chest CTA with no evidence of pulmonary embolism. 3. 2 cm left thyroid nodule. Recommend thyroid US.(Ref: J Am Coll Radiol. 2015 Feb;12(2): 143-50). Electronically Signed   By: Marnee Spring M.D.   On: 11/05/2020 05:09   DG Chest Port 1 View  Result Date: 11/06/2020 CLINICAL DATA:  Shortness of breath.  COVID. EXAM: PORTABLE CHEST 1 VIEW COMPARISON:  CT 11/05/2020.  Chest x-ray 11/04/2020. FINDINGS: Heart size stable. Diffuse bilateral pulmonary infiltrates/edema. Similar findings noted on prior exam. No pleural effusion or pneumothorax. Degenerative change thoracic spine. IMPRESSION: Diffuse bilateral pulmonary infiltrates/edema. Similar findings on prior exam. Findings consistent with COVID pneumonia. Electronically Signed   By: Maisie Fus  Register   On: 11/06/2020 08:01   DG Chest Port 1 View  Result Date: 11/04/2020 CLINICAL DATA:  59 year old male positive COVID-19.  Hypoxia. EXAM: PORTABLE CHEST 1 VIEW COMPARISON:  Portable chest 11/01/2020. FINDINGS: Portable AP semi upright view at 2016 hours. Mildly lower lung volumes and moderately progressed Patchy and confluent bilateral mid and lower lung opacity. Opacity is somewhat more extensive on the right now including some right upper lung involvement. The left upper lung remains spared. No pneumothorax. No pleural effusion is evident. Stable cardiac size and mediastinal contours. Paucity of bowel gas in the upper abdomen No acute osseous abnormality identified. Visualized tracheal air column is within normal limits. IMPRESSION: Progressed bilateral COVID-19 pneumonia. Electronically Signed   By: Odessa Fleming M.D.   On: 11/04/2020 20:34   DG Chest Portable 1 View  Result Date: 11/01/2020 CLINICAL DATA:  Fever, chills, body aches. EXAM: PORTABLE CHEST 1 VIEW  COMPARISON:  None. FINDINGS: Borderline cardiomegaly. Subtle patchy ground-glass opacities bilaterally. No pleural effusion or pneumothorax is seen. Osseous structures about the chest are unremarkable. IMPRESSION: 1. Subtle patchy ground-glass opacities bilaterally, suspicious for multifocal pneumonia. 2. Borderline cardiomegaly. Electronically Signed   By: Bary Hershal M.D.   On: 11/01/2020 10:33   DG Chest Port 1V same Day  Result Date: 11/07/2020 CLINICAL DATA:  Shortness of breath. EXAM: PORTABLE CHEST 1 VIEW COMPARISON:  November 06, 2020. FINDINGS: Stable cardiomediastinal silhouette. No pneumothorax or pleural effusion is noted. Stable bilateral lung opacities are noted consistent with multifocal pneumonia. Bony thorax is unremarkable. IMPRESSION: Stable bilateral lung opacities consistent with multifocal pneumonia. Electronically Signed   By: Lupita Raider M.D.   On: 11/07/2020 16:16

## 2020-11-10 LAB — COMPREHENSIVE METABOLIC PANEL
ALT: 49 U/L — ABNORMAL HIGH (ref 0–44)
AST: 21 U/L (ref 15–41)
Albumin: 2.6 g/dL — ABNORMAL LOW (ref 3.5–5.0)
Alkaline Phosphatase: 48 U/L (ref 38–126)
Anion gap: 8 (ref 5–15)
BUN: 28 mg/dL — ABNORMAL HIGH (ref 6–20)
CO2: 25 mmol/L (ref 22–32)
Calcium: 8.8 mg/dL — ABNORMAL LOW (ref 8.9–10.3)
Chloride: 106 mmol/L (ref 98–111)
Creatinine, Ser: 1.17 mg/dL (ref 0.61–1.24)
GFR, Estimated: >60 mL/min (ref >60)
Glucose, Bld: 157 mg/dL — ABNORMAL HIGH (ref 70–99)
Potassium: 4.6 mmol/L (ref 3.5–5.1)
Sodium: 139 mmol/L (ref 135–145)
Total Bilirubin: 0.8 mg/dL (ref 0.3–1.2)
Total Protein: 6.1 g/dL — ABNORMAL LOW (ref 6.5–8.1)

## 2020-11-10 LAB — CBC
HCT: 46.5 % (ref 39.0–52.0)
Hemoglobin: 15.8 g/dL (ref 13.0–17.0)
MCH: 29.3 pg (ref 26.0–34.0)
MCHC: 34 g/dL (ref 30.0–36.0)
MCV: 86.1 fL (ref 80.0–100.0)
Platelets: 349 10*3/uL (ref 150–400)
RBC: 5.4 MIL/uL (ref 4.22–5.81)
RDW: 13.4 % (ref 11.5–15.5)
WBC: 16.5 10*3/uL — ABNORMAL HIGH (ref 4.0–10.5)
nRBC: 0 % (ref 0.0–0.2)

## 2020-11-10 LAB — GLUCOSE, CAPILLARY
Glucose-Capillary: 148 mg/dL — ABNORMAL HIGH (ref 70–99)
Glucose-Capillary: 277 mg/dL — ABNORMAL HIGH (ref 70–99)
Glucose-Capillary: 249 mg/dL — ABNORMAL HIGH (ref 70–99)
Glucose-Capillary: 264 mg/dL — ABNORMAL HIGH (ref 70–99)

## 2020-11-10 LAB — D-DIMER, QUANTITATIVE
D-Dimer, Quant: 0.4 ug/mL-FEU (ref 0.00–0.50)
D-Dimer, Quant: 0.55 ug/mL-FEU — ABNORMAL HIGH (ref 0.00–0.50)

## 2020-11-10 LAB — C-REACTIVE PROTEIN: CRP: 0.7 mg/dL (ref <1.0)

## 2020-11-10 MED ORDER — ENOXAPARIN SODIUM 40 MG/0.4ML ~~LOC~~ SOLN
40.0000 mg | Freq: Two times a day (BID) | SUBCUTANEOUS | Status: DC
  Administered 2020-11-10 – 2020-11-13 (×6): 40 mg via SUBCUTANEOUS
  Filled 2020-11-10 (×6): qty 0.4

## 2020-11-10 MED ORDER — INSULIN ASPART 100 UNIT/ML ~~LOC~~ SOLN
14.0000 [IU] | Freq: Three times a day (TID) | SUBCUTANEOUS | Status: DC
  Administered 2020-11-10 – 2020-11-11 (×4): 14 [IU] via SUBCUTANEOUS

## 2020-11-10 NOTE — Progress Notes (Signed)
PROGRESS NOTE                                                                                                                                                                                                             Patient Demographics:    Benjamin Munoz, is a 59 y.o. male, DOB - 08/31/1961, WCH:[id number]  Outpatient Primary MD for the patient is Pcp, No   Admit date - 11/04/2020   LOS - 6  Chief Complaint  Patient presents with  . Covid Positive  . Shortness of Breath  . Weakness       Brief Narrative: Patient is a 59 y.o. male with no significant PMHx-tested positive for COVID-19 on 12/5-presented to the ED with approximately 5-6-day history of worsening shortness of breath-found of acute hypoxic respiratory failure due to COVID-19 pneumonia.  COVID-19 vaccinated status: Unvaccinated  Significant Events: 12/8>> Admit to [facility] for severe hypoxemia due to COVID-19 pneumonia 12/9>> transitioned to heated high flow from salter high flow due to worsening hypoxemia  Significant studies: 12/9>> CTA chest: No PE, Covid pattern pneumonia. 12/10>> chest x-ray: Diffuse bilateral pulmonary infiltrates. 12/11>> chest x-ray: Stable bilateral lung opacities  COVID-19 medications: Steroids: 12/8>> Remdesivir: 12/8>> Baricitinib: 12/8>>  Antibiotics: Rocephin: 12/8 x 1 Zithromax: 12/8 x 1  Microbiology data: 12/8 >>blood culture: Negative  Procedures: None  Consults: None  DVT prophylaxis: Lovenox at intermediate dosing.    Subjective:   Feels somewhat better-FiO2 slowly decreasing.   Assessment  & Plan :   Acute Hypoxic Resp Failure due to Covid 19 Viral pneumonia: Still with severe hypoxemia but over the past few days some decrease in FiO2-appears comfortable-continue steroid/Remdesivir.  CT chest on 12/13 neg for PE, lower extremity Doppler negative for DVT.  Continue supportive care-encourage  incentive spirometry/flutter valve.  Continue to encourage proning as much as possible.    Note-elevated D-dimer yesterday was probably a lab error.  CTA chest/lower extremity Doppler negative for VTE.  Fever: afebrile O2 requirements:  SpO2: 94 % O2 Flow Rate (L/min): (S) 20 L/min FiO2 (%): 60 %   COVID-19 Labs: Recent Labs    11/08/20 0430 11/09/20 0050 11/09/20 0749 11/10/20 0224 11/10/20 0600  DDIMER 0.50 >20.00*  --  0.40 0.55*  CRP 2.8*  --  1.1*  --  0.7       Component Value  Date/Time   BNP 44.4 11/04/2020 2050    Recent Labs  Lab 11/04/20 2050  PROCALCITON 0.46    Lab Results  Component Value Date   SARSCOV2NAA POSITIVE (A) 11/01/2020     Prone/Incentive Spirometry: encouraged patient to lie prone for 3-4 hours at a time for a total of 16 hours a day, and to encourage incentive spirometry use 3-4/hour.  Transaminitis: Secondary to COVID-19-follow.  AKI: Mild-likely hemodynamically mediated-has resolved  Newly diagnosed DM-2 (A1c 7.1) with uncontrolled hyperglycemia due to steroid use: CBGs remain uncontrolled-18 units, increase Premeal NovoLog to 14 units, continue resistant SSI.  Plan to initiate Metformin when closer to discharge.    Recent Labs    11/09/20 2036 11/10/20 0804 11/10/20 1232  GLUCAP 206* 148* 277*    2 cm left thyroid nodule: Seen incidentally on CT chest-stable for outpatient follow-up with PCP.  TSH stable at 0.714.    Obesity Body mass index is 31.03 kg/m.  GI prophylaxis: PPI  ABG: No results found for: PHART, PCO2ART, PO2ART, HCO3, TCO2, ACIDBASEDEF, O2SAT  Vent Settings: N/A  Condition - Extremely Guarded  Family Communication  :  Spouse Frederik Schmidt(Rosalie [phone]) updated over the phone 12/14  Code Status :  Full Code  Diet :  Diet Order            Diet Heart Room service appropriate? Yes; Fluid consistency: Thin  Diet effective now                  Disposition Plan  :   Status is: Inpatient  Remains  inpatient appropriate because:Inpatient level of care appropriate due to severity of illness   Dispo: The patient is from: Home              Anticipated d/c is to: Home              Anticipated d/c date is: > 3 days              Patient currently is not medically stable to d/c.    Barriers to discharge: Hypoxia requiring O2 supplementation/complete 5 days of IV Remdesivir  Antimicorbials  :    Anti-infectives (From admission, onward)   Start     Dose/Rate Route Frequency Ordered Stop   11/05/20 1000  remdesivir 100 mg in sodium chloride 0.9 % 100 mL IVPB       "Followed by" Linked Group Details   100 mg 200 mL/hr over 30 Minutes Intravenous Daily 11/04/20 2131 11/08/20 0928   11/05/20 0100  cefTRIAXone (ROCEPHIN) 2 g in sodium chloride 0.9 % 100 mL IVPB  Status:  Discontinued        2 g 200 mL/hr over 30 Minutes Intravenous Every 24 hours 11/05/20 0048 11/05/20 1222   11/05/20 0100  azithromycin (ZITHROMAX) 500 mg in sodium chloride 0.9 % 250 mL IVPB  Status:  Discontinued        500 mg 250 mL/hr over 60 Minutes Intravenous Every 24 hours 11/05/20 0048 11/05/20 1222   11/04/20 2200  remdesivir 200 mg in sodium chloride 0.9% 250 mL IVPB       "Followed by" Linked Group Details   200 mg 580 mL/hr over 30 Minutes Intravenous Once 11/04/20 2131 11/05/20 0049      Inpatient Medications  Scheduled Meds: . baricitinib  4 mg Oral Daily  . benzonatate  200 mg Oral TID  . enoxaparin (LOVENOX) injection  40 mg Subcutaneous BID  . influenza vac split quadrivalent PF  0.5  mL Intramuscular Tomorrow-1000  . insulin aspart  0-20 Units Subcutaneous TID WC  . insulin aspart  0-5 Units Subcutaneous QHS  . insulin aspart  10 Units Subcutaneous TID WC  . insulin glargine  18 Units Subcutaneous Daily  . methylPREDNISolone (SOLU-MEDROL) injection  1 mg/kg Intravenous Q12H  . pantoprazole  40 mg Oral Q1200   Continuous Infusions:  PRN Meds:.acetaminophen, albuterol, alum & mag  hydroxide-simeth, chlorpheniramine-HYDROcodone, clonazepam, guaiFENesin-dextromethorphan, ondansetron **OR** ondansetron (ZOFRAN) IV   Time Spent in minutes  35    See all Orders from today for further details   Jeoffrey Massed M.D on 11/10/2020 at 2:13 PM  To page go to [email or web address] - use universal password  [facility] -  Office  [phone]    Objective:   Vitals:   11/10/20 0440 11/10/20 0755 11/10/20 0857 11/10/20 1205  BP: 127/74 120/72  122/77  Pulse: 79 78 (!) 104 80  Resp: 20  20   Temp: 98.7 F (37.1 C) 97.7 F (36.5 C)  97.6 F (36.4 C)  TempSrc: Axillary Oral  Oral  SpO2: 91% 95% 90% 94%  Weight:      Height:        Wt Readings from Last 3 Encounters:  11/04/20 95.3 kg  11/01/20 95.3 kg  01/09/18 97.5 kg     Intake/Output Summary (Last 24 hours) at 11/10/2020 1413 Last data filed at 11/10/2020 1209 Gross per 24 hour  Intake 740 ml  Output 1050 ml  Net -310 ml     Physical Exam Gen Exam:Alert awake-not in any distress HEENT:atraumatic, normocephalic Chest: B/L clear to auscultation anteriorly CVS:S1S2 regular Abdomen:soft non tender, non distended Extremities:no edema Neurology: Non focal Skin: no rash   Data Review:    CBC Recent Labs  Lab 11/05/20 0640 11/06/20 0110 11/07/20 0240 11/08/20 0430 11/09/20 0050 11/10/20 0600  WBC 7.6 11.3* 14.7* 13.4* 13.6* 16.5*  HGB 14.3 15.0 15.2 14.8 14.8 15.8  HCT 42.7 45.7 47.0 46.2 44.9 46.5  PLT 234 308 275 363 262 349  MCV 86.6 86.2 87.5 87.3 89.3 86.1  MCH 29.0 28.3 28.3 28.0 29.4 29.3  MCHC 33.5 32.8 32.3 32.0 33.0 34.0  RDW 13.9 13.7 13.9 13.6 13.8 13.4  LYMPHSABS 1.2 3.0 2.1 1.6 1.4  --   MONOABS 0.1 0.6 0.7 0.8 0.8  --   EOSABS 0.0 0.0 0.0 0.0 0.0  --   BASOSABS 0.0 0.0 0.0 0.0 0.1  --     Chemistries  Recent Labs  Lab 11/06/20 0110 11/07/20 0240 11/08/20 0430 11/09/20 0749 11/10/20 0600  NA 140 142 144 138 139  K 3.9 4.3 4.5 4.3 4.6  CL 104 104 107 104 106   CO2 22 25 26 25 25   GLUCOSE 191* 256* 167* 200* 157*  BUN 23* 28* 29* 28* 28*  CREATININE 1.25* 1.37* 1.21 1.21 1.17  CALCIUM 9.1 9.3 9.1 8.9 8.8*  AST 97* 48* 30 27 21   ALT 117* 94* 73* 62* 49*  ALKPHOS 51 49 46 52 48  BILITOT 0.5 0.3 0.7 0.8 0.8   ------------------------------------------------------------------------------------------------------------------ No results for input(s): CHOL, HDL, LDLCALC, TRIG, CHOLHDL, LDLDIRECT in the last 72 hours.  Lab Results  Component Value Date   HGBA1C 7.1 (H) 11/06/2020   ------------------------------------------------------------------------------------------------------------------ No results for input(s): TSH, T4TOTAL, T3FREE, THYROIDAB in the last 72 hours.  Invalid input(s): FREET3 ------------------------------------------------------------------------------------------------------------------ No results for input(s): VITAMINB12, FOLATE, FERRITIN, TIBC, IRON, RETICCTPCT in the last 72 hours.  Coagulation profile No results for input(s): INR, PROTIME  in the last 168 hours.  Recent Labs    11/10/20 0224 11/10/20 0600  DDIMER 0.40 0.55*    Cardiac Enzymes No results for input(s): CKMB, TROPONINI, MYOGLOBIN in the last 168 hours.  Invalid input(s): CK ------------------------------------------------------------------------------------------------------------------    Component Value Date/Time   BNP 44.4 11/04/2020 2050    Micro Results Recent Results (from the past 240 hour(s))  Resp Panel by RT-PCR (Flu A&B, Covid) Nasopharyngeal Swab     Status: Abnormal   Collection Time: 11/01/20 10:02 AM   Specimen: Nasopharyngeal Swab; Nasopharyngeal(NP) swabs in vial transport medium  Result Value Ref Range Status   SARS Coronavirus 2 by RT PCR POSITIVE (A) NEGATIVE Final    Comment: RESULT CALLED TO, READ BACK BY AND VERIFIED WITH: EVeva Holes RN, ST 1124 11/01/20 D. VANHOOK (NOTE) SARS-CoV-2 target nucleic acids are  DETECTED.  The SARS-CoV-2 RNA is generally detectable in upper respiratory specimens during the acute phase of infection. Positive results are indicative of the presence of the identified virus, but do not rule out bacterial infection or co-infection with other pathogens not detected by the test. Clinical correlation with patient history and other diagnostic information is necessary to determine patient infection status. The expected result is Negative.  Fact Sheet for Patients: BloggerCourse.com  Fact Sheet for Healthcare Providers: SeriousBroker.it  This test is not yet approved or cleared by the [place] FDA and  has been authorized for detection and/or diagnosis of SARS-CoV-2 by FDA under an Emergency Use Authorization (EUA).  This EUA will remain in effect (meaning this tes t can be used) for the duration of  the COVID-19 declaration under Section 564(b)(1) of the Act, 21 U.S.C. section 360bbb-3(b)(1), unless the authorization is terminated or revoked sooner.     Influenza A by PCR NEGATIVE NEGATIVE Final   Influenza B by PCR NEGATIVE NEGATIVE Final    Comment: (NOTE) The Xpert Xpress SARS-CoV-2/FLU/RSV plus assay is intended as an aid in the diagnosis of influenza from Nasopharyngeal swab specimens and should not be used as a sole basis for treatment. Nasal washings and aspirates are unacceptable for Xpert Xpress SARS-CoV-2/FLU/RSV testing.  Fact Sheet for Patients: BloggerCourse.com  Fact Sheet for Healthcare Providers: SeriousBroker.it  This test is not yet approved or cleared by the [place] FDA and has been authorized for detection and/or diagnosis of SARS-CoV-2 by FDA under an Emergency Use Authorization (EUA). This EUA will remain in effect (meaning this test can be used) for the duration of the COVID-19 declaration under Section 564(b)(1) of the Act, 21  U.S.C. section 360bbb-3(b)(1), unless the authorization is terminated or revoked.  Performed at [facility], [address], [place], [place] [address]   Blood Culture (routine x 2)     Status: None   Collection Time: 11/04/20  8:52 PM   Specimen: BLOOD  Result Value Ref Range Status   Specimen Description BLOOD LEFT ANTECUBITAL  Final   Special Requests   Final    BOTTLES DRAWN AEROBIC AND ANAEROBIC Blood Culture results may not be optimal due to an inadequate volume of blood received in culture bottles   Culture   Final    NO GROWTH 5 DAYS Performed at [facility], [address] [place], [place] [address]    Report Status 11/09/2020 FINAL  Final  Blood Culture (routine x 2)     Status: None   Collection Time: 11/04/20  8:55 PM   Specimen: BLOOD  Result Value Ref Range Status  Specimen Description BLOOD RIGHT ANTECUBITAL  Final   Special Requests   Final    BOTTLES DRAWN AEROBIC AND ANAEROBIC Blood Culture adequate volume   Culture   Final    NO GROWTH 5 DAYS Performed at [facility], [address] [place], [place] [address]    Report Status 11/09/2020 FINAL  Final    Radiology Reports CT ANGIO CHEST PE W OR WO CONTRAST  Result Date: 11/09/2020 CLINICAL DATA:  COVID-19.  Shortness of breath EXAM: CT ANGIOGRAPHY CHEST WITH CONTRAST TECHNIQUE: Multidetector CT imaging of the chest was performed using the standard protocol during bolus administration of intravenous contrast. Multiplanar CT image reconstructions and MIPs were obtained to evaluate the vascular anatomy. CONTRAST:  OMNIPAQUE IOHEXOL 350 MG/ML SOLN COMPARISON:  11/05/2020 FINDINGS: Cardiovascular: Contrast injection is sufficient to demonstrate satisfactory opacification of the pulmonary arteries to the segmental level. There is no pulmonary embolus or evidence of right heart strain. The size of the main pulmonary artery is normal. Heart size is normal, with no pericardial effusion. The  course and caliber of the aorta are normal. There is no atherosclerotic calcification. Opacification decreased due to pulmonary arterial phase contrast bolus timing. Mediastinum/Nodes: No mediastinal, hilar or axillary lymphadenopathy. 2 cm left thyroid nodule. Thoracic esophageal course is normal. Lungs/Pleura: Multifocal, peripheral predominant ground glass opacities. No pleural effusion. Upper Abdomen: Contrast bolus timing is not optimized for evaluation of the abdominal organs. The visualized portions of the organs of the upper abdomen are normal. Musculoskeletal: No chest wall abnormality. No bony spinal canal stenosis. Review of the MIP images confirms the above findings. IMPRESSION: 1. No pulmonary embolus or acute aortic syndrome. 2. Unchanged pattern of COVID-19 pneumonia. 3. 2 cm left thyroid nodule. Recommend thyroid US (ref: J Am Coll Radiol. 2015 Feb;12(2): 143-50). Electronically Signed   By: Deatra Robinson M.D.   On: 11/09/2020 23:30   CT Angio Chest PE W and/or Wo Contrast  Result Date: 11/05/2020 CLINICAL DATA:  COVID positive.  Shortness of breath EXAM: CT ANGIOGRAPHY CHEST WITH CONTRAST TECHNIQUE: Multidetector CT imaging of the chest was performed using the standard protocol during bolus administration of intravenous contrast. Multiplanar CT image reconstructions and MIPs were obtained to evaluate the vascular anatomy. CONTRAST:  OMNIPAQUE IOHEXOL 350 MG/ML SOLN COMPARISON:  Chest x-ray from yesterday and 4 days ago FINDINGS: Cardiovascular: Satisfactory opacification of the pulmonary arteries to the segmental level. No evidence of pulmonary embolism when allowing for levels of motion artifact. Normal heart size. No pericardial effusion. Mediastinum/Nodes: 2 cm left thyroid nodule. No adenopathy in the mediastinum. Lungs/Pleura: Patchy ground-glass opacity throughout all lobes with intervening normal lung. Lung volumes are low. No visible effusion or pneumothorax. Upper Abdomen: There  could be hepatic steatosis, but certainty is limited by arterial timing. Musculoskeletal: Spondylosis. Review of the MIP images confirms the above findings. IMPRESSION: 1. COVID pattern pneumonia. 2. Motion degraded chest CTA with no evidence of pulmonary embolism. 3. 2 cm left thyroid nodule. Recommend thyroid US.(Ref: J Am Coll Radiol. 2015 Feb;12(2): 143-50). Electronically Signed   By: Marnee Spring M.D.   On: 11/05/2020 05:09   DG Chest Port 1 View  Result Date: 11/06/2020 CLINICAL DATA:  Shortness of breath.  COVID. EXAM: PORTABLE CHEST 1 VIEW COMPARISON:  CT 11/05/2020.  Chest x-ray 11/04/2020. FINDINGS: Heart size stable. Diffuse bilateral pulmonary infiltrates/edema. Similar findings noted on prior exam. No pleural effusion or pneumothorax. Degenerative change thoracic spine. IMPRESSION: Diffuse bilateral pulmonary infiltrates/edema. Similar findings on prior  exam. Findings consistent with COVID pneumonia. Electronically Signed   By: Maisie Fus  Register   On: 11/06/2020 08:01   DG Chest Port 1 View  Result Date: 11/04/2020 CLINICAL DATA:  59 year old male positive COVID-19.  Hypoxia. EXAM: PORTABLE CHEST 1 VIEW COMPARISON:  Portable chest 11/01/2020. FINDINGS: Portable AP semi upright view at 2016 hours. Mildly lower lung volumes and moderately progressed Patchy and confluent bilateral mid and lower lung opacity. Opacity is somewhat more extensive on the right now including some right upper lung involvement. The left upper lung remains spared. No pneumothorax. No pleural effusion is evident. Stable cardiac size and mediastinal contours. Paucity of bowel gas in the upper abdomen No acute osseous abnormality identified. Visualized tracheal air column is within normal limits. IMPRESSION: Progressed bilateral COVID-19 pneumonia. Electronically Signed   By: Odessa Fleming M.D.   On: 11/04/2020 20:34   DG Chest Portable 1 View  Result Date: 11/01/2020 CLINICAL DATA:  Fever, chills, body aches. EXAM: PORTABLE  CHEST 1 VIEW COMPARISON:  None. FINDINGS: Borderline cardiomegaly. Subtle patchy ground-glass opacities bilaterally. No pleural effusion or pneumothorax is seen. Osseous structures about the chest are unremarkable. IMPRESSION: 1. Subtle patchy ground-glass opacities bilaterally, suspicious for multifocal pneumonia. 2. Borderline cardiomegaly. Electronically Signed   By: Bary Ashad M.D.   On: 11/01/2020 10:33   DG Chest Port 1V same Day  Result Date: 11/07/2020 CLINICAL DATA:  Shortness of breath. EXAM: PORTABLE CHEST 1 VIEW COMPARISON:  November 06, 2020. FINDINGS: Stable cardiomediastinal silhouette. No pneumothorax or pleural effusion is noted. Stable bilateral lung opacities are noted consistent with multifocal pneumonia. Bony thorax is unremarkable. IMPRESSION: Stable bilateral lung opacities consistent with multifocal pneumonia. Electronically Signed   By: Lupita Raider M.D.   On: 11/07/2020 16:16   VAS [place] LOWER EXTREMITY VENOUS (DVT)  Result Date: 11/09/2020  Lower Venous DVT Study Other Indications: Covid, D-Dimer. Comparison Study: No previous exam Performing Technologist: Clint Guy RVT  Examination Guidelines: A complete evaluation includes B-mode imaging, spectral Doppler, color Doppler, and power Doppler as needed of all accessible portions of each vessel. Bilateral testing is considered an integral part of a complete examination. Limited examinations for reoccurring indications may be performed as noted. The reflux portion of the exam is performed with the patient in reverse Trendelenburg.  +---------+---------------+---------+-----------+----------+--------------+ RIGHT    CompressibilityPhasicitySpontaneityPropertiesThrombus Aging +---------+---------------+---------+-----------+----------+--------------+ CFV      Full           Yes      Yes                                 +---------+---------------+---------+-----------+----------+--------------+ SFJ      Full                                                         +---------+---------------+---------+-----------+----------+--------------+ FV Prox  Full                                                        +---------+---------------+---------+-----------+----------+--------------+ FV Mid   Full                                                        +---------+---------------+---------+-----------+----------+--------------+  FV DistalFull                                                        +---------+---------------+---------+-----------+----------+--------------+ PFV      Full                                                        +---------+---------------+---------+-----------+----------+--------------+ POP      Full           Yes      Yes                                 +---------+---------------+---------+-----------+----------+--------------+ PTV      Full                                                        +---------+---------------+---------+-----------+----------+--------------+ PERO     Full                                                        +---------+---------------+---------+-----------+----------+--------------+   +---------+---------------+---------+-----------+----------+--------------+ LEFT     CompressibilityPhasicitySpontaneityPropertiesThrombus Aging +---------+---------------+---------+-----------+----------+--------------+ CFV      Full           Yes      Yes                                 +---------+---------------+---------+-----------+----------+--------------+ SFJ      Full                                                        +---------+---------------+---------+-----------+----------+--------------+ FV Prox  Full                                                        +---------+---------------+---------+-----------+----------+--------------+ FV Mid   Full                                                         +---------+---------------+---------+-----------+----------+--------------+ FV DistalFull                                                        +---------+---------------+---------+-----------+----------+--------------+  PFV      Full                                                        +---------+---------------+---------+-----------+----------+--------------+ POP      Full           Yes      Yes                                 +---------+---------------+---------+-----------+----------+--------------+ PTV      Full                                                        +---------+---------------+---------+-----------+----------+--------------+ PERO     Full                                                        +---------+---------------+---------+-----------+----------+--------------+     Summary: RIGHT: - There is no evidence of deep vein thrombosis in the lower extremity.  - No cystic structure found in the popliteal fossa.  LEFT: - There is no evidence of deep vein thrombosis in the lower extremity.  - No cystic structure found in the popliteal fossa.  *See table(s) above for measurements and observations. Electronically signed by Fabienne Bruns MD on 11/09/2020 at 7:47:20 PM.    Final

## 2020-11-10 NOTE — Progress Notes (Signed)
Inpatient Diabetes Program Recommendations  AACE/ADA: New Consensus Statement on Inpatient Glycemic Control (2015)  Target Ranges:  Prepandial:   less than 140 mg/dL      Peak postprandial:   less than 180 mg/dL (1-2 hours)      Critically ill patients:  140 - 180 mg/dL   Lab Results  Component Value Date   GLUCAP 277 (H) 11/10/2020   HGBA1C 7.1 (H) 11/06/2020    Review of Glycemic Control Results for Benjamin Munoz, Benjamin Munoz (MRN [id number]) as of 11/10/2020 12:36  Ref. Range 11/09/2020 11:43 11/09/2020 16:13 11/09/2020 20:36 11/10/2020 08:04 11/10/2020 12:32  Glucose-Capillary Latest Ref Range: 70 - 99 mg/dL 465 (H) 035 (H) 465 (H) 148 (H) 277 (H)   Diabetes history: No prior hx  Outpatient Diabetes medications: None Current orders for Inpatient glycemic control: Lantus 18 units qd + Novolog 10 units tid meal coverage + Novolog 0-20 units tid + hs 0-5 units  Inpatient Diabetes Program Recommendations:    Noted postprandial CBGs elevated and fasting is good @ 176. -Increase meal coverage to 14 units tid if eats 50% Secure chat sent to Dr. Jerral Ralph.  Thank you, Billy Fischer. Taelon Bendorf, RN, MSN, CDE  Diabetes Coordinator Inpatient Glycemic Control Team Team Pager [phone] (8am-5pm) 11/10/2020 12:39 PM

## 2020-11-10 NOTE — Progress Notes (Signed)
Occupational Therapy Treatment Patient Details Name: Benjamin Munoz MRN: [id number] DOB: 04/11/1961 Today's Date: 11/10/2020    History of present illness 59 y.o. male with medical history significant of no chronic medical problems. Seen in ED on 12/6.  Diagnosed with COVID. Returned to ED 11/05/20 with increasing symptoms.   OT comments  Pt seen on 20 L Heated high flow. Marching in place 75 steps x 2 with SpO2 remaining in 90s with VSS. 1/4 DOE. Completed flutter valve and incentive spirometer exercises x 5 each - able to pull 600 ml. Pt had not yet began his flutter valve exercises. Educated pt on importance of breathing ex in addition to mobility. Pt verbalized understanding however requires continued vc to complete accurately and appropriately. Began education regarding energy conservation. Pt very appreciative. Will continue to follow.   Follow Up Recommendations  No OT follow up    Equipment Recommendations  Tub/shower seat    Recommendations for Other Services      Precautions / Restrictions Precautions Precautions: Fall       Mobility Bed Mobility               General bed mobility comments: OOB in chair  Transfers Overall transfer level: Needs assistance Equipment used: Rolling walker (2 wheeled) Transfers: Sit to/from Stand Sit to Stand: Supervision         General transfer comment: RW used to stedy self during marching    Balance Overall balance assessment: Mild deficits observed, not formally tested                                         ADL either performed or assessed with clinical judgement   ADL                                         General ADL Comments: Education regarding energy conservation. Pt requires multiple vc     Vision       Perception     Praxis      Cognition Arousal/Alertness: Awake/alert Behavior During Therapy: WFL for tasks assessed/performed Overall Cognitive Status:  Impaired/Different from baseline Area of Impairment: Attention;Memory;Awareness                   Current Attention Level: Selective Memory: Decreased short-term memory     Awareness: Emergent   General Comments: Pt given instructions to perform flutter/incentive spirometer 5 x then perform again every hour. after finishing educating pt, he continued to complete breathing exercise. Again educated pt on need to complete exercises only 5-10 times per hour, pt again started doing flutter valve. Slow processing. very pleasant. will continue to assess        Exercises Other Exercises Other Exercises: marching x 75 steps x 2 with Spo2 in 90sFLUTTER VALVE X 5; INCENTIVE SPIROMETER X 5 - ABLE TO PULL 600 ML Other Exercises: PURSED LIP BREATHING x 10in combination with shoulder flexion on inspriation and extension on expiration   Shoulder Instructions       General Comments      Pertinent Vitals/ Pain       Pain Assessment: No/denies pain  Home Living  Prior Functioning/Environment              Frequency  Min 3X/week        Progress Toward Goals  OT Goals(current goals can now be found in the care plan section)  Progress towards OT goals: Progressing toward goals  Acute Rehab OT Goals Patient Stated Goal: return to independent baseline OT Goal Formulation: With patient Time For Goal Achievement: 11/21/20 Potential to Achieve Goals: Good ADL Goals Pt/caregiver will Perform Home Exercise Program: Increased strength;Both right and left upper extremity;With theraband;With written HEP provided Additional ADL Goal #1: Pt will recall and apply at least 3 energy conservation strategies to apply to ADL routine Additional ADL Goal #2: Pt will self monitor and maintain SpO2 >88% during ADL  Plan Discharge plan remains appropriate    Co-evaluation                 AM-PAC OT "6 Clicks" Daily Activity      Outcome Measure   Help from another person eating meals?: None Help from another person taking care of personal grooming?: None Help from another person toileting, which includes using toliet, bedpan, or urinal?: A Little Help from another person bathing (including washing, rinsing, drying)?: A Little Help from another person to put on and taking off regular upper body clothing?: None Help from another person to put on and taking off regular lower body clothing?: None 6 Click Score: 22    End of Session Equipment Utilized During Treatment: Oxygen (20 L Heated high flow)  OT Visit Diagnosis: Other abnormalities of gait and mobility (R26.89)   Activity Tolerance Patient tolerated treatment well   Patient Left in chair;with call bell/phone within reach   Nurse Communication Mobility status;Other (comment) (asked to check heated high flow unit)        Time: 8502-7741 OT Time Calculation (min): 23 min  Charges: OT General Charges $OT Visit: 1 Visit OT Treatments $Self Care/Home Management : 8-22 mins $Therapeutic Activity: 8-22 mins  Luisa Dago, OT/L   Acute OT Clinical Specialist Acute Rehabilitation Services Pager [phone] Office [phone]    [facility] 11/10/2020, 4:39 PM

## 2020-11-10 NOTE — Progress Notes (Signed)
PT Cancellation Note  Patient Details Name: FRANCES JOYNT MRN: [id number] DOB: [birth year]   Cancelled Treatment:    Reason Eval/Treat Not Completed: Other (comment) Pt just worked with OT and with limited activity tolerance.  Will f/u at later date. Anise Salvo, PT Acute Rehab Services Pager [phone] [facility] [phone]    Rayetta Humphrey 11/10/2020, 5:45 PM

## 2020-11-11 ENCOUNTER — Encounter (HOSPITAL_COMMUNITY): Payer: Self-pay | Admitting: Internal Medicine

## 2020-11-11 LAB — GLUCOSE, CAPILLARY
Glucose-Capillary: 128 mg/dL — ABNORMAL HIGH (ref 70–99)
Glucose-Capillary: 234 mg/dL — ABNORMAL HIGH (ref 70–99)
Glucose-Capillary: 271 mg/dL — ABNORMAL HIGH (ref 70–99)
Glucose-Capillary: 220 mg/dL — ABNORMAL HIGH (ref 70–99)

## 2020-11-11 LAB — CBC
HCT: 41.7 % (ref 39.0–52.0)
Hemoglobin: 14.2 g/dL (ref 13.0–17.0)
MCH: 29.2 pg (ref 26.0–34.0)
MCHC: 34.1 g/dL (ref 30.0–36.0)
MCV: 85.8 fL (ref 80.0–100.0)
Platelets: 418 10*3/uL — ABNORMAL HIGH (ref 150–400)
RBC: 4.86 MIL/uL (ref 4.22–5.81)
RDW: 13.2 % (ref 11.5–15.5)
WBC: 18.8 10*3/uL — ABNORMAL HIGH (ref 4.0–10.5)
nRBC: 0 % (ref 0.0–0.2)

## 2020-11-11 LAB — COMPREHENSIVE METABOLIC PANEL
ALT: 44 U/L (ref 0–44)
AST: 15 U/L (ref 15–41)
Albumin: 2.5 g/dL — ABNORMAL LOW (ref 3.5–5.0)
Alkaline Phosphatase: 45 U/L (ref 38–126)
Anion gap: 7 (ref 5–15)
BUN: 26 mg/dL — ABNORMAL HIGH (ref 6–20)
CO2: 25 mmol/L (ref 22–32)
Calcium: 8.8 mg/dL — ABNORMAL LOW (ref 8.9–10.3)
Chloride: 102 mmol/L (ref 98–111)
Creatinine, Ser: 1.07 mg/dL (ref 0.61–1.24)
GFR, Estimated: >60 mL/min (ref >60)
Glucose, Bld: 164 mg/dL — ABNORMAL HIGH (ref 70–99)
Potassium: 4.7 mmol/L (ref 3.5–5.1)
Sodium: 134 mmol/L — ABNORMAL LOW (ref 135–145)
Total Bilirubin: 0.4 mg/dL (ref 0.3–1.2)
Total Protein: 5.9 g/dL — ABNORMAL LOW (ref 6.5–8.1)

## 2020-11-11 LAB — D-DIMER, QUANTITATIVE: D-Dimer, Quant: 0.39 ug/mL-FEU (ref 0.00–0.50)

## 2020-11-11 LAB — C-REACTIVE PROTEIN: CRP: 0.7 mg/dL (ref <1.0)

## 2020-11-11 MED ORDER — METHYLPREDNISOLONE SODIUM SUCC 125 MG IJ SOLR
60.0000 mg | Freq: Two times a day (BID) | INTRAMUSCULAR | Status: DC
  Administered 2020-11-11: 21:00:00 60 mg via INTRAVENOUS
  Filled 2020-11-11: qty 2

## 2020-11-11 NOTE — Progress Notes (Signed)
Physical Therapy Evaluation Patient Details Name: Benjamin Munoz MRN: [id number] DOB: July 10, 1961 Today's Date: 11/11/2020   History of Present Illness  Pt is a 59 y.o. male seen in ED 11/02/20 with (+) COVID-19, now admitted 11/04/20 with worsening symptoms. Workup for acute hypoxic respiratory failure due to COVID-19 pneumonia. No pertinent PMH.    Clinical Impression  Pt progressing well with mobility; very motivated to participate and now able to mobilize on portable O2 tank (as opposed to heated HFNC). Pt performed multiple bouts of ambulation in room with SpO2 92-98% on 15L O2 [place]; down to 85% on 10L O2 [place] (unsure reliable pleth). And maintaining 96% on 12L O2 HFNC at rest (RN notified). Pt remains limited by decreased activity tolerance. Will continue to follow acutely.    Follow Up Recommendations Home health PT;Supervision - Intermittent (may not need)    Equipment Recommendations  None recommended by PT    Recommendations for Other Services       Precautions / Restrictions Precautions Precautions: Fall;Other (comment) Precaution Comments: Watch SpO2 Restrictions Weight Bearing Restrictions: No      Mobility  Bed Mobility               General bed mobility comments: OOB in chair    Transfers Overall transfer level: Independent Equipment used: None;Rolling walker (2 wheeled) Transfers: Sit to/from Stand Sit to Stand: Supervision         General transfer comment: Initial request to use RW; multiple sit<>stands from recliner, independent with and without walker  Ambulation/Gait Ambulation/Gait assistance: Supervision Gait Distance (Feet): 40 Feet (+40'+80') Assistive device: Rolling walker (2 wheeled);None Gait Pattern/deviations: Step-through pattern;Decreased stride length Gait velocity: Decreased   General Gait Details: Initial ambulation 58' with RW (per pt request), slow and steady gait on 15L O2 HFNC; seated rest, then ambulated an additional 40'  on 15L without DME; seated rest, then additional 80' on 10L with SpO2 down to 85% (unsure reliable pleth) and pt endorsing fatigue  Stairs            Wheelchair Mobility    Modified Rankin (Stroke Patients Only)       Balance Overall balance assessment: No apparent balance deficits (not formally assessed)                                           Pertinent Vitals/Pain Pain Assessment: No/denies pain    Home Living                        Prior Function                 Hand Dominance        Extremity/Trunk Assessment                Communication      Cognition Arousal/Alertness: Awake/alert Behavior During Therapy: WFL for tasks assessed/performed Overall Cognitive Status: Within Functional Limits for tasks assessed                                 General Comments: WFL for simple tasks, cognition not challenged or formally assessed; following commands appropriately      General Comments General comments (skin integrity, edema, etc.): SpO2 maintaining 96% on 12L O2 HFNC at rest (RN notified)    Exercises  Assessment/Plan    PT Assessment    PT Problem List         PT Treatment Interventions      PT Goals (Current goals can be found in the Care Plan section)       Frequency Min 3X/week   Barriers to discharge        Co-evaluation               AM-PAC PT "6 Clicks" Mobility  Outcome Measure Help needed turning from your back to your side while in a flat bed without using bedrails?: None Help needed moving from lying on your back to sitting on the side of a flat bed without using bedrails?: None Help needed moving to and from a bed to a chair (including a wheelchair)?: None Help needed standing up from a chair using your arms (e.g., wheelchair or bedside chair)?: None Help needed to walk in hospital room?: A Little Help needed climbing 3-5 steps with a railing? : A Little 6 Click  Score: 22    End of Session Equipment Utilized During Treatment: Oxygen Activity Tolerance: Patient tolerated treatment well Patient left: in chair;with call bell/phone within reach Nurse Communication: Mobility status PT Visit Diagnosis: Difficulty in walking, not elsewhere classified (R26.2)    Time: 2355-7322 PT Time Calculation (min) (ACUTE ONLY): 18 min   Charges:     PT Treatments $Therapeutic Exercise: 8-22 mins       Ina Homes, PT, DPT Acute Rehabilitation Services  Pager [phone] Office [phone]  Malachy Chamber 11/11/2020, 5:36 PM

## 2020-11-11 NOTE — Progress Notes (Signed)
PROGRESS NOTE                                                                                                                                                                                                             Patient Demographics:    Benjamin Munoz, is a 59 y.o. male, DOB - Mar 23, 1961, GBE:[id number]  Outpatient Primary MD for the patient is Pcp, No   Admit date - 11/04/2020   LOS - 7  Chief Complaint  Patient presents with  . Covid Positive  . Shortness of Breath  . Weakness       Brief Narrative: Patient is a 59 y.o. male with no significant PMHx-tested positive for COVID-19 on 12/5-presented to the ED with approximately 5-6-day history of worsening shortness of breath-found of acute hypoxic respiratory failure due to COVID-19 pneumonia.  COVID-19 vaccinated status: Unvaccinated  Significant Events: 12/8>> Admit to [facility] for severe hypoxemia due to COVID-19 pneumonia 12/9>> transitioned to heated high flow from salter high flow due to worsening hypoxemia  Significant studies: 12/9>> CTA chest: No PE, Covid pattern pneumonia. 12/10>> chest x-ray: Diffuse bilateral pulmonary infiltrates. 12/11>> chest x-ray: Stable bilateral lung opacities 12/13>> CTA chest: No PE 12/13>> bilateral lower extremity Doppler: No DVT.  COVID-19 medications: Steroids: 12/8>> Remdesivir: 12/8>> 12/12 Baricitinib: 12/8>>  Antibiotics: Rocephin: 12/8 x 1 Zithromax: 12/8 x 1  Microbiology data: 12/8 >>blood culture: Negative  Procedures: None  Consults: None  DVT prophylaxis: Lovenox at intermediate dosing.    Subjective:   Unchanged compared to yesterday-no chest shortness of breath or chest pain.  Remains on heated high flow.   Assessment  & Plan :   Acute Hypoxic Resp Failure due to Covid 19 Viral pneumonia: Continues have severe hypoxemia but slowly improving over the past few days-with decreasing FiO2  requirements.  Continue steroids/baricitinib.    Continue supportive care-encourage incentive spirometry/flutter valve.  Continue to encourage proning as much as possible.     Fever: afebrile O2 requirements:  SpO2: 94 % O2 Flow Rate (L/min): 20 L/min FiO2 (%): 60 %   COVID-19 Labs: Recent Labs    11/09/20 0749 11/10/20 0224 11/10/20 0600 11/11/20 0054  DDIMER  --  0.40 0.55* 0.39  CRP 1.1*  --  0.7 0.7       Component Value Date/Time   BNP 44.4 11/04/2020 2050  Recent Labs  Lab 11/04/20 2050  PROCALCITON 0.46    Lab Results  Component Value Date   SARSCOV2NAA POSITIVE (A) 11/01/2020     Prone/Incentive Spirometry: encouraged patient to lie prone for 3-4 hours at a time for a total of 16 hours a day, and to encourage incentive spirometry use 3-4/hour.  Transaminitis: Secondary to COVID-19-follow.  AKI: Mild-likely hemodynamically mediated-has resolved  Newly diagnosed DM-2 (A1c 7.1) with uncontrolled hyperglycemia due to steroid use: CBGs remain uncontrolled-continue Lantus 18 units, 14 units of Premeal NovoLog and resistant SSI.  Watch closely-may require further adjustment of insulin doses as steroid dosage has been decreased today. Plan to initiate Metformin when closer to discharge.    Recent Labs    11/10/20 1747 11/10/20 2042 11/11/20 0725  GLUCAP 249* 264* 128*    2 cm left thyroid nodule: Seen incidentally on CT chest-stable for outpatient follow-up with PCP.  TSH stable at 0.714.    Obesity Body mass index is 31.03 kg/m.  GI prophylaxis: PPI  ABG: No results found for: PHART, PCO2ART, PO2ART, HCO3, TCO2, ACIDBASEDEF, O2SAT  Vent Settings: N/A  Condition - Extremely Guarded  Family Communication  :  Spouse Frederik Schmidt(Rosalie [phone]) updated over the phone 12/15  Code Status :  Full Code  Diet :  Diet Order            Diet Heart Room service appropriate? Yes; Fluid consistency: Thin  Diet effective now                  Disposition  Plan  :   Status is: Inpatient  Remains inpatient appropriate because:Inpatient level of care appropriate due to severity of illness   Dispo: The patient is from: Home              Anticipated d/c is to: Home              Anticipated d/c date is: > 3 days              Patient currently is not medically stable to d/c.    Barriers to discharge: Hypoxia requiring O2 supplementation/complete 5 days of IV Remdesivir  Antimicorbials  :    Anti-infectives (From admission, onward)   Start     Dose/Rate Route Frequency Ordered Stop   11/05/20 1000  remdesivir 100 mg in sodium chloride 0.9 % 100 mL IVPB       "Followed by" Linked Group Details   100 mg 200 mL/hr over 30 Minutes Intravenous Daily 11/04/20 2131 11/08/20 0928   11/05/20 0100  cefTRIAXone (ROCEPHIN) 2 g in sodium chloride 0.9 % 100 mL IVPB  Status:  Discontinued        2 g 200 mL/hr over 30 Minutes Intravenous Every 24 hours 11/05/20 0048 11/05/20 1222   11/05/20 0100  azithromycin (ZITHROMAX) 500 mg in sodium chloride 0.9 % 250 mL IVPB  Status:  Discontinued        500 mg 250 mL/hr over 60 Minutes Intravenous Every 24 hours 11/05/20 0048 11/05/20 1222   11/04/20 2200  remdesivir 200 mg in sodium chloride 0.9% 250 mL IVPB       "Followed by" Linked Group Details   200 mg 580 mL/hr over 30 Minutes Intravenous Once 11/04/20 2131 11/05/20 0049      Inpatient Medications  Scheduled Meds: . baricitinib  4 mg Oral Daily  . benzonatate  200 mg Oral TID  . enoxaparin (LOVENOX) injection  40 mg Subcutaneous BID  . influenza  vac split quadrivalent PF  0.5 mL Intramuscular Tomorrow-1000  . insulin aspart  0-20 Units Subcutaneous TID WC  . insulin aspart  0-5 Units Subcutaneous QHS  . insulin aspart  14 Units Subcutaneous TID WC  . insulin glargine  18 Units Subcutaneous Daily  . methylPREDNISolone (SOLU-MEDROL) injection  1 mg/kg Intravenous Q12H  . pantoprazole  40 mg Oral Q1200   Continuous Infusions:  PRN  Meds:.acetaminophen, albuterol, alum & mag hydroxide-simeth, chlorpheniramine-HYDROcodone, clonazepam, guaiFENesin-dextromethorphan, ondansetron **OR** ondansetron (ZOFRAN) IV   Time Spent in minutes  35    See all Orders from today for further details   Jeoffrey Massed M.D on 11/11/2020 at 11:43 AM  To page go to [email or web address] - use universal password  [facility] -  Office  [phone]    Objective:   Vitals:   11/11/20 0344 11/11/20 0412 11/11/20 0722 11/11/20 0847  BP: 118/69  130/83   Pulse: 77 77 70   Resp: (!) 23 (!) 23 18   Temp: 97.8 F (36.6 C)  98.7 F (37.1 C)   TempSrc: Axillary  Axillary   SpO2: 92% 92% 92% 94%  Weight:      Height:        Wt Readings from Last 3 Encounters:  11/04/20 95.3 kg  11/01/20 95.3 kg  01/09/18 97.5 kg     Intake/Output Summary (Last 24 hours) at 11/11/2020 1143 Last data filed at 11/11/2020 0300 Gross per 24 hour  Intake --  Output 1275 ml  Net -1275 ml     Physical Exam Gen Exam:Alert awake-not in any distress HEENT:atraumatic, normocephalic Chest: B/L clear to auscultation anteriorly CVS:S1S2 regular Abdomen:soft non tender, non distended Extremities:no edema Neurology: Non focal Skin: no rash  Data Review:    CBC Recent Labs  Lab 11/05/20 0640 11/06/20 0110 11/07/20 0240 11/08/20 0430 11/09/20 0050 11/10/20 0600 11/11/20 0054  WBC 7.6 11.3* 14.7* 13.4* 13.6* 16.5* 18.8*  HGB 14.3 15.0 15.2 14.8 14.8 15.8 14.2  HCT 42.7 45.7 47.0 46.2 44.9 46.5 41.7  PLT 234 308 275 363 262 349 418*  MCV 86.6 86.2 87.5 87.3 89.3 86.1 85.8  MCH 29.0 28.3 28.3 28.0 29.4 29.3 29.2  MCHC 33.5 32.8 32.3 32.0 33.0 34.0 34.1  RDW 13.9 13.7 13.9 13.6 13.8 13.4 13.2  LYMPHSABS 1.2 3.0 2.1 1.6 1.4  --   --   MONOABS 0.1 0.6 0.7 0.8 0.8  --   --   EOSABS 0.0 0.0 0.0 0.0 0.0  --   --   BASOSABS 0.0 0.0 0.0 0.0 0.1  --   --     Chemistries  Recent Labs  Lab 11/07/20 0240 11/08/20 0430 11/09/20 0749  11/10/20 0600 11/11/20 0054  NA 142 144 138 139 134*  K 4.3 4.5 4.3 4.6 4.7  CL 104 107 104 106 102  CO2 25 26 25 25 25   GLUCOSE 256* 167* 200* 157* 164*  BUN 28* 29* 28* 28* 26*  CREATININE 1.37* 1.21 1.21 1.17 1.07  CALCIUM 9.3 9.1 8.9 8.8* 8.8*  AST 48* 30 27 21 15   ALT 94* 73* 62* 49* 44  ALKPHOS 49 46 52 48 45  BILITOT 0.3 0.7 0.8 0.8 0.4   ------------------------------------------------------------------------------------------------------------------ No results for input(s): CHOL, HDL, LDLCALC, TRIG, CHOLHDL, LDLDIRECT in the last 72 hours.  Lab Results  Component Value Date   HGBA1C 7.1 (H) 11/06/2020   ------------------------------------------------------------------------------------------------------------------ No results for input(s): TSH, T4TOTAL, T3FREE, THYROIDAB in the last 72 hours.  Invalid input(s): FREET3 ------------------------------------------------------------------------------------------------------------------  No results for input(s): VITAMINB12, FOLATE, FERRITIN, TIBC, IRON, RETICCTPCT in the last 72 hours.  Coagulation profile No results for input(s): INR, PROTIME in the last 168 hours.  Recent Labs    11/10/20 0600 11/11/20 0054  DDIMER 0.55* 0.39    Cardiac Enzymes No results for input(s): CKMB, TROPONINI, MYOGLOBIN in the last 168 hours.  Invalid input(s): CK ------------------------------------------------------------------------------------------------------------------    Component Value Date/Time   BNP 44.4 11/04/2020 2050    Micro Results Recent Results (from the past 240 hour(s))  Blood Culture (routine x 2)     Status: None   Collection Time: 11/04/20  8:52 PM   Specimen: BLOOD  Result Value Ref Range Status   Specimen Description BLOOD LEFT ANTECUBITAL  Final   Special Requests   Final    BOTTLES DRAWN AEROBIC AND ANAEROBIC Blood Culture results may not be optimal due to an inadequate volume of blood received in  culture bottles   Culture   Final    NO GROWTH 5 DAYS Performed at [facility], [address], [place], [place] [address]    Report Status 11/09/2020 FINAL  Final  Blood Culture (routine x 2)     Status: None   Collection Time: 11/04/20  8:55 PM   Specimen: BLOOD  Result Value Ref Range Status   Specimen Description BLOOD RIGHT ANTECUBITAL  Final   Special Requests   Final    BOTTLES DRAWN AEROBIC AND ANAEROBIC Blood Culture adequate volume   Culture   Final    NO GROWTH 5 DAYS Performed at [facility], [address] [place], [place] [address]    Report Status 11/09/2020 FINAL  Final    Radiology Reports CT ANGIO CHEST PE W OR WO CONTRAST  Result Date: 11/09/2020 CLINICAL DATA:  COVID-19.  Shortness of breath EXAM: CT ANGIOGRAPHY CHEST WITH CONTRAST TECHNIQUE: Multidetector CT imaging of the chest was performed using the standard protocol during bolus administration of intravenous contrast. Multiplanar CT image reconstructions and MIPs were obtained to evaluate the vascular anatomy. CONTRAST:  OMNIPAQUE IOHEXOL 350 MG/ML SOLN COMPARISON:  11/05/2020 FINDINGS: Cardiovascular: Contrast injection is sufficient to demonstrate satisfactory opacification of the pulmonary arteries to the segmental level. There is no pulmonary embolus or evidence of right heart strain. The size of the main pulmonary artery is normal. Heart size is normal, with no pericardial effusion. The course and caliber of the aorta are normal. There is no atherosclerotic calcification. Opacification decreased due to pulmonary arterial phase contrast bolus timing. Mediastinum/Nodes: No mediastinal, hilar or axillary lymphadenopathy. 2 cm left thyroid nodule. Thoracic esophageal course is normal. Lungs/Pleura: Multifocal, peripheral predominant ground glass opacities. No pleural effusion. Upper Abdomen: Contrast bolus timing is not optimized for evaluation of the abdominal organs. The visualized portions of  the organs of the upper abdomen are normal. Musculoskeletal: No chest wall abnormality. No bony spinal canal stenosis. Review of the MIP images confirms the above findings. IMPRESSION: 1. No pulmonary embolus or acute aortic syndrome. 2. Unchanged pattern of COVID-19 pneumonia. 3. 2 cm left thyroid nodule. Recommend thyroid US (ref: J Am Coll Radiol. 2015 Feb;12(2): 143-50). Electronically Signed   By: Deatra Robinson M.D.   On: 11/09/2020 23:30   CT Angio Chest PE W and/or Wo Contrast  Result Date: 11/05/2020 CLINICAL DATA:  COVID positive.  Shortness of breath EXAM: CT ANGIOGRAPHY CHEST WITH CONTRAST TECHNIQUE: Multidetector CT imaging of the chest was performed using the standard protocol during bolus administration of intravenous  contrast. Multiplanar CT image reconstructions and MIPs were obtained to evaluate the vascular anatomy. CONTRAST:  OMNIPAQUE IOHEXOL 350 MG/ML SOLN COMPARISON:  Chest x-ray from yesterday and 4 days ago FINDINGS: Cardiovascular: Satisfactory opacification of the pulmonary arteries to the segmental level. No evidence of pulmonary embolism when allowing for levels of motion artifact. Normal heart size. No pericardial effusion. Mediastinum/Nodes: 2 cm left thyroid nodule. No adenopathy in the mediastinum. Lungs/Pleura: Patchy ground-glass opacity throughout all lobes with intervening normal lung. Lung volumes are low. No visible effusion or pneumothorax. Upper Abdomen: There could be hepatic steatosis, but certainty is limited by arterial timing. Musculoskeletal: Spondylosis. Review of the MIP images confirms the above findings. IMPRESSION: 1. COVID pattern pneumonia. 2. Motion degraded chest CTA with no evidence of pulmonary embolism. 3. 2 cm left thyroid nodule. Recommend thyroid US.(Ref: J Am Coll Radiol. 2015 Feb;12(2): 143-50). Electronically Signed   By: Marnee Spring M.D.   On: 11/05/2020 05:09   DG Chest Port 1 View  Result Date: 11/06/2020 CLINICAL DATA:  Shortness  of breath.  COVID. EXAM: PORTABLE CHEST 1 VIEW COMPARISON:  CT 11/05/2020.  Chest x-ray 11/04/2020. FINDINGS: Heart size stable. Diffuse bilateral pulmonary infiltrates/edema. Similar findings noted on prior exam. No pleural effusion or pneumothorax. Degenerative change thoracic spine. IMPRESSION: Diffuse bilateral pulmonary infiltrates/edema. Similar findings on prior exam. Findings consistent with COVID pneumonia. Electronically Signed   By: Maisie Fus  Register   On: 11/06/2020 08:01   DG Chest Port 1 View  Result Date: 11/04/2020 CLINICAL DATA:  59 year old male positive COVID-19.  Hypoxia. EXAM: PORTABLE CHEST 1 VIEW COMPARISON:  Portable chest 11/01/2020. FINDINGS: Portable AP semi upright view at 2016 hours. Mildly lower lung volumes and moderately progressed Patchy and confluent bilateral mid and lower lung opacity. Opacity is somewhat more extensive on the right now including some right upper lung involvement. The left upper lung remains spared. No pneumothorax. No pleural effusion is evident. Stable cardiac size and mediastinal contours. Paucity of bowel gas in the upper abdomen No acute osseous abnormality identified. Visualized tracheal air column is within normal limits. IMPRESSION: Progressed bilateral COVID-19 pneumonia. Electronically Signed   By: Odessa Fleming M.D.   On: 11/04/2020 20:34   DG Chest Portable 1 View  Result Date: 11/01/2020 CLINICAL DATA:  Fever, chills, body aches. EXAM: PORTABLE CHEST 1 VIEW COMPARISON:  None. FINDINGS: Borderline cardiomegaly. Subtle patchy ground-glass opacities bilaterally. No pleural effusion or pneumothorax is seen. Osseous structures about the chest are unremarkable. IMPRESSION: 1. Subtle patchy ground-glass opacities bilaterally, suspicious for multifocal pneumonia. 2. Borderline cardiomegaly. Electronically Signed   By: Bary Danyell M.D.   On: 11/01/2020 10:33   DG Chest Port 1V same Day  Result Date: 11/07/2020 CLINICAL DATA:  Shortness of breath. EXAM:  PORTABLE CHEST 1 VIEW COMPARISON:  November 06, 2020. FINDINGS: Stable cardiomediastinal silhouette. No pneumothorax or pleural effusion is noted. Stable bilateral lung opacities are noted consistent with multifocal pneumonia. Bony thorax is unremarkable. IMPRESSION: Stable bilateral lung opacities consistent with multifocal pneumonia. Electronically Signed   By: Lupita Raider M.D.   On: 11/07/2020 16:16   VAS [place] LOWER EXTREMITY VENOUS (DVT)  Result Date: 11/09/2020  Lower Venous DVT Study Other Indications: Covid, D-Dimer. Comparison Study: No previous exam Performing Technologist: Clint Guy RVT  Examination Guidelines: A complete evaluation includes B-mode imaging, spectral Doppler, color Doppler, and power Doppler as needed of all accessible portions of each vessel. Bilateral testing is considered an integral part of a complete examination. Limited examinations  for reoccurring indications may be performed as noted. The reflux portion of the exam is performed with the patient in reverse Trendelenburg.  +---------+---------------+---------+-----------+----------+--------------+ RIGHT    CompressibilityPhasicitySpontaneityPropertiesThrombus Aging +---------+---------------+---------+-----------+----------+--------------+ CFV      Full           Yes      Yes                                 +---------+---------------+---------+-----------+----------+--------------+ SFJ      Full                                                        +---------+---------------+---------+-----------+----------+--------------+ FV Prox  Full                                                        +---------+---------------+---------+-----------+----------+--------------+ FV Mid   Full                                                        +---------+---------------+---------+-----------+----------+--------------+ FV DistalFull                                                         +---------+---------------+---------+-----------+----------+--------------+ PFV      Full                                                        +---------+---------------+---------+-----------+----------+--------------+ POP      Full           Yes      Yes                                 +---------+---------------+---------+-----------+----------+--------------+ PTV      Full                                                        +---------+---------------+---------+-----------+----------+--------------+ PERO     Full                                                        +---------+---------------+---------+-----------+----------+--------------+   +---------+---------------+---------+-----------+----------+--------------+ LEFT     CompressibilityPhasicitySpontaneityPropertiesThrombus Aging +---------+---------------+---------+-----------+----------+--------------+ CFV      Full  Yes      Yes                                 +---------+---------------+---------+-----------+----------+--------------+ SFJ      Full                                                        +---------+---------------+---------+-----------+----------+--------------+ FV Prox  Full                                                        +---------+---------------+---------+-----------+----------+--------------+ FV Mid   Full                                                        +---------+---------------+---------+-----------+----------+--------------+ FV DistalFull                                                        +---------+---------------+---------+-----------+----------+--------------+ PFV      Full                                                        +---------+---------------+---------+-----------+----------+--------------+ POP      Full           Yes      Yes                                  +---------+---------------+---------+-----------+----------+--------------+ PTV      Full                                                        +---------+---------------+---------+-----------+----------+--------------+ PERO     Full                                                        +---------+---------------+---------+-----------+----------+--------------+     Summary: RIGHT: - There is no evidence of deep vein thrombosis in the lower extremity.  - No cystic structure found in the popliteal fossa.  LEFT: - There is no evidence of deep vein thrombosis in the lower extremity.  - No cystic structure found in the popliteal fossa.  *See table(s) above for measurements and observations. Electronically signed by Fabienne Bruns MD on 11/09/2020 at 7:47:20  PM.    Final

## 2020-11-12 LAB — CBC
HCT: 42.7 % (ref 39.0–52.0)
Hemoglobin: 14.4 g/dL (ref 13.0–17.0)
MCH: 28.9 pg (ref 26.0–34.0)
MCHC: 33.7 g/dL (ref 30.0–36.0)
MCV: 85.6 fL (ref 80.0–100.0)
Platelets: 367 10*3/uL (ref 150–400)
RBC: 4.99 MIL/uL (ref 4.22–5.81)
RDW: 13.2 % (ref 11.5–15.5)
WBC: 20.1 10*3/uL — ABNORMAL HIGH (ref 4.0–10.5)
nRBC: 0 % (ref 0.0–0.2)

## 2020-11-12 LAB — GLUCOSE, CAPILLARY
Glucose-Capillary: 94 mg/dL (ref 70–99)
Glucose-Capillary: 243 mg/dL — ABNORMAL HIGH (ref 70–99)
Glucose-Capillary: 212 mg/dL — ABNORMAL HIGH (ref 70–99)
Glucose-Capillary: 218 mg/dL — ABNORMAL HIGH (ref 70–99)

## 2020-11-12 LAB — COMPREHENSIVE METABOLIC PANEL
ALT: 39 U/L (ref 0–44)
AST: 18 U/L (ref 15–41)
Albumin: 2.5 g/dL — ABNORMAL LOW (ref 3.5–5.0)
Alkaline Phosphatase: 46 U/L (ref 38–126)
Anion gap: 9 (ref 5–15)
BUN: 25 mg/dL — ABNORMAL HIGH (ref 6–20)
CO2: 22 mmol/L (ref 22–32)
Calcium: 8.9 mg/dL (ref 8.9–10.3)
Chloride: 105 mmol/L (ref 98–111)
Creatinine, Ser: 1.12 mg/dL (ref 0.61–1.24)
GFR, Estimated: >60 mL/min (ref >60)
Glucose, Bld: 85 mg/dL (ref 70–99)
Potassium: 4.8 mmol/L (ref 3.5–5.1)
Sodium: 136 mmol/L (ref 135–145)
Total Bilirubin: 0.9 mg/dL (ref 0.3–1.2)
Total Protein: 5.9 g/dL — ABNORMAL LOW (ref 6.5–8.1)

## 2020-11-12 LAB — C-REACTIVE PROTEIN: CRP: 0.6 mg/dL (ref <1.0)

## 2020-11-12 LAB — D-DIMER, QUANTITATIVE: D-Dimer, Quant: <0.27 ug/mL-FEU (ref 0.00–0.50)

## 2020-11-12 MED ORDER — INSULIN ASPART 100 UNIT/ML ~~LOC~~ SOLN
10.0000 [IU] | Freq: Three times a day (TID) | SUBCUTANEOUS | Status: DC
  Administered 2020-11-12: 08:00:00 10 [IU] via SUBCUTANEOUS

## 2020-11-12 MED ORDER — INSULIN GLARGINE 100 UNIT/ML ~~LOC~~ SOLN
12.0000 [IU] | Freq: Every day | SUBCUTANEOUS | Status: DC
  Administered 2020-11-12: 09:00:00 12 [IU] via SUBCUTANEOUS
  Filled 2020-11-12 (×2): qty 0.12

## 2020-11-12 MED ORDER — METHYLPREDNISOLONE SODIUM SUCC 40 MG IJ SOLR
40.0000 mg | Freq: Two times a day (BID) | INTRAMUSCULAR | Status: DC
  Administered 2020-11-12 (×2): 40 mg via INTRAVENOUS
  Filled 2020-11-12 (×2): qty 1

## 2020-11-12 MED ORDER — INSULIN ASPART 100 UNIT/ML ~~LOC~~ SOLN
8.0000 [IU] | Freq: Three times a day (TID) | SUBCUTANEOUS | Status: DC
  Administered 2020-11-12 (×2): 8 [IU] via SUBCUTANEOUS

## 2020-11-12 NOTE — Progress Notes (Signed)
PROGRESS NOTE                                                                                                                                                                                                             Patient Demographics:    Benjamin CampionRichard Munoz, is a 59 y.o. male, DOB - 1961/10/10, UEA:[id number]  Outpatient Primary MD for the patient is Pcp, No   Admit date - 11/04/2020   LOS - 8  Chief Complaint  Patient presents with  . Covid Positive  . Shortness of Breath  . Weakness       Brief Narrative: Patient is a 59 y.o. male with no significant PMHx-tested positive for COVID-19 on 12/5-presented to the ED with approximately 5-6-day history of worsening shortness of breath-found of acute hypoxic respiratory failure due to COVID-19 pneumonia.  COVID-19 vaccinated status: Unvaccinated  Significant Events: 12/8>> Admit to [facility] for severe hypoxemia due to COVID-19 pneumonia 12/9>> transitioned to heated high flow from salter high flow due to worsening hypoxemia 12/15>> clinically improving-transition to salter high flow.  Significant studies: 12/9>> CTA chest: No PE, Covid pattern pneumonia. 12/10>> chest x-ray: Diffuse bilateral pulmonary infiltrates. 12/11>> chest x-ray: Stable bilateral lung opacities 12/13>> CTA chest: No PE 12/13>> bilateral lower extremity Doppler: No DVT.  COVID-19 medications: Steroids: 12/8>> Remdesivir: 12/8>> 12/12 Baricitinib: 12/8>>  Antibiotics: Rocephin: 12/8 x 1 Zithromax: 12/8 x 1  Microbiology data: 12/8 >>blood culture: Negative  Procedures: None  Consults: None  DVT prophylaxis: Lovenox at intermediate dosing.    Subjective:   Much better-transition to salter high flow yesterday-rapidly improving now-Down to 1-2 L at rest-requires around 4 L with ambulation.   Assessment  & Plan :   Acute Hypoxic Resp Failure due to Covid 19 Viral pneumonia: Had  severe hypoxemia on initial presentation (requiring heated high flow)-transitioned to salter high flow on 12/15-subsequently clinically improved with decreasing oxygen requirements-down to 1-2 L at rest-around 4 L with ambulation.  Continue to taper steroids further-remains on baricitinib-suspect that if clinical improvement continues-probably can go home tomorrow with home O2.    Fever: afebrile O2 requirements:  SpO2: 91 % O2 Flow Rate (L/min): 3 L/min FiO2 (%): 60 %   COVID-19 Labs: Recent Labs    11/10/20 0600 11/11/20 0054 11/12/20 0455  DDIMER 0.55* 0.39 <0.27  CRP 0.7 0.7 0.6  Component Value Date/Time   BNP 44.4 11/04/2020 2050    No results for input(s): PROCALCITON in the last 168 hours.  Lab Results  Component Value Date   SARSCOV2NAA POSITIVE (A) 11/01/2020     Prone/Incentive Spirometry: encouraged patient to lie prone for 3-4 hours at a time for a total of 16 hours a day, and to encourage incentive spirometry use 3-4/hour.  Transaminitis: Secondary to COVID-19-follow.  AKI: Mild-likely hemodynamically mediated-has resolved  Newly diagnosed DM-2 (A1c 7.1) with uncontrolled hyperglycemia due to steroid use: CBGs much better as steroid dosage has been decreased significantly-decrease Lantus to 12 units, decrease premeal NovoLog to 8 units with meals-continue SSI.  Follow and adjust.  Suspect may need sliding scale insulin on discharge-we will also plan on initiating Metformin on discharge.    Recent Labs    11/11/20 1649 11/11/20 2101 11/12/20 0758  GLUCAP 271* 220* 94    2 cm left thyroid nodule: Seen incidentally on CT chest-stable for outpatient follow-up with PCP.  TSH stable at 0.714.    Obesity Body mass index is 31.03 kg/m.  GI prophylaxis: PPI  ABG: No results found for: PHART, PCO2ART, PO2ART, HCO3, TCO2, ACIDBASEDEF, O2SAT  Vent Settings: N/A  Condition - Extremely Guarded  Family Communication  :  Spouse Frederik Schmidt [phone])  updated over the phone 12/15  Code Status :  Full Code  Diet :  Diet Order            Diet Heart Room service appropriate? Yes; Fluid consistency: Thin  Diet effective now                  Disposition Plan  :   Status is: Inpatient  Remains inpatient appropriate because:Inpatient level of care appropriate due to severity of illness   Dispo: The patient is from: Home              Anticipated d/c is to: Home              Anticipated d/c date is: > 1 days              Patient currently is not medically stable to d/c.    Barriers to discharge: Hypoxia requiring O2 supplementation  Antimicorbials  :    Anti-infectives (From admission, onward)   Start     Dose/Rate Route Frequency Ordered Stop   11/05/20 1000  remdesivir 100 mg in sodium chloride 0.9 % 100 mL IVPB       "Followed by" Linked Group Details   100 mg 200 mL/hr over 30 Minutes Intravenous Daily 11/04/20 2131 11/08/20 0928   11/05/20 0100  cefTRIAXone (ROCEPHIN) 2 g in sodium chloride 0.9 % 100 mL IVPB  Status:  Discontinued        2 g 200 mL/hr over 30 Minutes Intravenous Every 24 hours 11/05/20 0048 11/05/20 1222   11/05/20 0100  azithromycin (ZITHROMAX) 500 mg in sodium chloride 0.9 % 250 mL IVPB  Status:  Discontinued        500 mg 250 mL/hr over 60 Minutes Intravenous Every 24 hours 11/05/20 0048 11/05/20 1222   11/04/20 2200  remdesivir 200 mg in sodium chloride 0.9% 250 mL IVPB       "Followed by" Linked Group Details   200 mg 580 mL/hr over 30 Minutes Intravenous Once 11/04/20 2131 11/05/20 0049      Inpatient Medications  Scheduled Meds: . baricitinib  4 mg Oral Daily  . benzonatate  200 mg Oral TID  .  enoxaparin (LOVENOX) injection  40 mg Subcutaneous BID  . influenza vac split quadrivalent PF  0.5 mL Intramuscular Tomorrow-1000  . insulin aspart  0-20 Units Subcutaneous TID WC  . insulin aspart  0-5 Units Subcutaneous QHS  . insulin aspart  10 Units Subcutaneous TID WC  . insulin glargine   12 Units Subcutaneous Daily  . methylPREDNISolone (SOLU-MEDROL) injection  40 mg Intravenous Q12H  . pantoprazole  40 mg Oral Q1200   Continuous Infusions:  PRN Meds:.acetaminophen, albuterol, alum & mag hydroxide-simeth, chlorpheniramine-HYDROcodone, clonazepam, guaiFENesin-dextromethorphan, ondansetron **OR** ondansetron (ZOFRAN) IV   Time Spent in minutes  25    See all Orders from today for further details   Jeoffrey Massed M.D on 11/12/2020 at 11:40 AM  To page go to [email or web address] - use universal password  [facility] -  Office  [phone]    Objective:   Vitals:   11/12/20 0350 11/12/20 0421 11/12/20 0741 11/12/20 0828  BP: (!) 127/91  123/70   Pulse: 74 74  85  Resp: 18 18 18 18   Temp: 98.3 F (36.8 C)  97.7 F (36.5 C)   TempSrc: Oral  Oral   SpO2: 94% 97%  91%  Weight:      Height:        Wt Readings from Last 3 Encounters:  11/04/20 95.3 kg  11/01/20 95.3 kg  01/09/18 97.5 kg     Intake/Output Summary (Last 24 hours) at 11/12/2020 1140 Last data filed at 11/12/2020 0835 Gross per 24 hour  Intake 960 ml  Output 1550 ml  Net -590 ml     Physical Exam Gen Exam:Alert awake-not in any distress HEENT:atraumatic, normocephalic Chest: B/L clear to auscultation anteriorly CVS:S1S2 regular Abdomen:soft non tender, non distended Extremities:no edema Neurology: Non focal Skin: no rash   Data Review:    CBC Recent Labs  Lab 11/06/20 0110 11/07/20 0240 11/08/20 0430 11/09/20 0050 11/10/20 0600 11/11/20 0054 11/12/20 0455  WBC 11.3* 14.7* 13.4* 13.6* 16.5* 18.8* 20.1*  HGB 15.0 15.2 14.8 14.8 15.8 14.2 14.4  HCT 45.7 47.0 46.2 44.9 46.5 41.7 42.7  PLT 308 275 363 262 349 418* 367  MCV 86.2 87.5 87.3 89.3 86.1 85.8 85.6  MCH 28.3 28.3 28.0 29.4 29.3 29.2 28.9  MCHC 32.8 32.3 32.0 33.0 34.0 34.1 33.7  RDW 13.7 13.9 13.6 13.8 13.4 13.2 13.2  LYMPHSABS 3.0 2.1 1.6 1.4  --   --   --   MONOABS 0.6 0.7 0.8 0.8  --   --   --   EOSABS  0.0 0.0 0.0 0.0  --   --   --   BASOSABS 0.0 0.0 0.0 0.1  --   --   --     Chemistries  Recent Labs  Lab 11/08/20 0430 11/09/20 0749 11/10/20 0600 11/11/20 0054 11/12/20 0455  NA 144 138 139 134* 136  K 4.5 4.3 4.6 4.7 4.8  CL 107 104 106 102 105  CO2 26 25 25 25 22   GLUCOSE 167* 200* 157* 164* 85  BUN 29* 28* 28* 26* 25*  CREATININE 1.21 1.21 1.17 1.07 1.12  CALCIUM 9.1 8.9 8.8* 8.8* 8.9  AST 30 27 21 15 18   ALT 73* 62* 49* 44 39  ALKPHOS 46 52 48 45 46  BILITOT 0.7 0.8 0.8 0.4 0.9   ------------------------------------------------------------------------------------------------------------------ No results for input(s): CHOL, HDL, LDLCALC, TRIG, CHOLHDL, LDLDIRECT in the last 72 hours.  Lab Results  Component Value Date   HGBA1C 7.1 (H) 11/06/2020   ------------------------------------------------------------------------------------------------------------------  No results for input(s): TSH, T4TOTAL, T3FREE, THYROIDAB in the last 72 hours.  Invalid input(s): FREET3 ------------------------------------------------------------------------------------------------------------------ No results for input(s): VITAMINB12, FOLATE, FERRITIN, TIBC, IRON, RETICCTPCT in the last 72 hours.  Coagulation profile No results for input(s): INR, PROTIME in the last 168 hours.  Recent Labs    11/11/20 0054 11/12/20 0455  DDIMER 0.39 <0.27    Cardiac Enzymes No results for input(s): CKMB, TROPONINI, MYOGLOBIN in the last 168 hours.  Invalid input(s): CK ------------------------------------------------------------------------------------------------------------------    Component Value Date/Time   BNP 44.4 11/04/2020 2050    Micro Results Recent Results (from the past 240 hour(s))  Blood Culture (routine x 2)     Status: None   Collection Time: 11/04/20  8:52 PM   Specimen: BLOOD  Result Value Ref Range Status   Specimen Description BLOOD LEFT ANTECUBITAL  Final   Special  Requests   Final    BOTTLES DRAWN AEROBIC AND ANAEROBIC Blood Culture results may not be optimal due to an inadequate volume of blood received in culture bottles   Culture   Final    NO GROWTH 5 DAYS Performed at [facility], [address] [place], [place] [address]    Report Status 11/09/2020 FINAL  Final  Blood Culture (routine x 2)     Status: None   Collection Time: 11/04/20  8:55 PM   Specimen: BLOOD  Result Value Ref Range Status   Specimen Description BLOOD RIGHT ANTECUBITAL  Final   Special Requests   Final    BOTTLES DRAWN AEROBIC AND ANAEROBIC Blood Culture adequate volume   Culture   Final    NO GROWTH 5 DAYS Performed at [facility], [address], [place], [place] [address]    Report Status 11/09/2020 FINAL  Final    Radiology Reports CT ANGIO CHEST PE W OR WO CONTRAST  Result Date: 11/09/2020 CLINICAL DATA:  COVID-19.  Shortness of breath EXAM: CT ANGIOGRAPHY CHEST WITH CONTRAST TECHNIQUE: Multidetector CT imaging of the chest was performed using the standard protocol during bolus administration of intravenous contrast. Multiplanar CT image reconstructions and MIPs were obtained to evaluate the vascular anatomy. CONTRAST:  OMNIPAQUE IOHEXOL 350 MG/ML SOLN COMPARISON:  11/05/2020 FINDINGS: Cardiovascular: Contrast injection is sufficient to demonstrate satisfactory opacification of the pulmonary arteries to the segmental level. There is no pulmonary embolus or evidence of right heart strain. The size of the main pulmonary artery is normal. Heart size is normal, with no pericardial effusion. The course and caliber of the aorta are normal. There is no atherosclerotic calcification. Opacification decreased due to pulmonary arterial phase contrast bolus timing. Mediastinum/Nodes: No mediastinal, hilar or axillary lymphadenopathy. 2 cm left thyroid nodule. Thoracic esophageal course is normal. Lungs/Pleura: Multifocal, peripheral predominant ground glass  opacities. No pleural effusion. Upper Abdomen: Contrast bolus timing is not optimized for evaluation of the abdominal organs. The visualized portions of the organs of the upper abdomen are normal. Musculoskeletal: No chest wall abnormality. No bony spinal canal stenosis. Review of the MIP images confirms the above findings. IMPRESSION: 1. No pulmonary embolus or acute aortic syndrome. 2. Unchanged pattern of COVID-19 pneumonia. 3. 2 cm left thyroid nodule. Recommend thyroid US (ref: J Am Coll Radiol. 2015 Feb;12(2): 143-50). Electronically Signed   By: Deatra Robinson M.D.   On: 11/09/2020 23:30   CT Angio Chest PE W and/or Wo Contrast  Result Date: 11/05/2020 CLINICAL DATA:  COVID positive.  Shortness of breath EXAM: CT ANGIOGRAPHY CHEST WITH CONTRAST  TECHNIQUE: Multidetector CT imaging of the chest was performed using the standard protocol during bolus administration of intravenous contrast. Multiplanar CT image reconstructions and MIPs were obtained to evaluate the vascular anatomy. CONTRAST:  OMNIPAQUE IOHEXOL 350 MG/ML SOLN COMPARISON:  Chest x-ray from yesterday and 4 days ago FINDINGS: Cardiovascular: Satisfactory opacification of the pulmonary arteries to the segmental level. No evidence of pulmonary embolism when allowing for levels of motion artifact. Normal heart size. No pericardial effusion. Mediastinum/Nodes: 2 cm left thyroid nodule. No adenopathy in the mediastinum. Lungs/Pleura: Patchy ground-glass opacity throughout all lobes with intervening normal lung. Lung volumes are low. No visible effusion or pneumothorax. Upper Abdomen: There could be hepatic steatosis, but certainty is limited by arterial timing. Musculoskeletal: Spondylosis. Review of the MIP images confirms the above findings. IMPRESSION: 1. COVID pattern pneumonia. 2. Motion degraded chest CTA with no evidence of pulmonary embolism. 3. 2 cm left thyroid nodule. Recommend thyroid US.(Ref: J Am Coll Radiol. 2015 Feb;12(2):  143-50). Electronically Signed   By: Marnee Spring M.D.   On: 11/05/2020 05:09   DG Chest Port 1 View  Result Date: 11/06/2020 CLINICAL DATA:  Shortness of breath.  COVID. EXAM: PORTABLE CHEST 1 VIEW COMPARISON:  CT 11/05/2020.  Chest x-ray 11/04/2020. FINDINGS: Heart size stable. Diffuse bilateral pulmonary infiltrates/edema. Similar findings noted on prior exam. No pleural effusion or pneumothorax. Degenerative change thoracic spine. IMPRESSION: Diffuse bilateral pulmonary infiltrates/edema. Similar findings on prior exam. Findings consistent with COVID pneumonia. Electronically Signed   By: Maisie Fus  Register   On: 11/06/2020 08:01   DG Chest Port 1 View  Result Date: 11/04/2020 CLINICAL DATA:  59 year old male positive COVID-19.  Hypoxia. EXAM: PORTABLE CHEST 1 VIEW COMPARISON:  Portable chest 11/01/2020. FINDINGS: Portable AP semi upright view at 2016 hours. Mildly lower lung volumes and moderately progressed Patchy and confluent bilateral mid and lower lung opacity. Opacity is somewhat more extensive on the right now including some right upper lung involvement. The left upper lung remains spared. No pneumothorax. No pleural effusion is evident. Stable cardiac size and mediastinal contours. Paucity of bowel gas in the upper abdomen No acute osseous abnormality identified. Visualized tracheal air column is within normal limits. IMPRESSION: Progressed bilateral COVID-19 pneumonia. Electronically Signed   By: Odessa Fleming M.D.   On: 11/04/2020 20:34   DG Chest Portable 1 View  Result Date: 11/01/2020 CLINICAL DATA:  Fever, chills, body aches. EXAM: PORTABLE CHEST 1 VIEW COMPARISON:  None. FINDINGS: Borderline cardiomegaly. Subtle patchy ground-glass opacities bilaterally. No pleural effusion or pneumothorax is seen. Osseous structures about the chest are unremarkable. IMPRESSION: 1. Subtle patchy ground-glass opacities bilaterally, suspicious for multifocal pneumonia. 2. Borderline cardiomegaly.  Electronically Signed   By: Bary Teaghan M.D.   On: 11/01/2020 10:33   DG Chest Port 1V same Day  Result Date: 11/07/2020 CLINICAL DATA:  Shortness of breath. EXAM: PORTABLE CHEST 1 VIEW COMPARISON:  November 06, 2020. FINDINGS: Stable cardiomediastinal silhouette. No pneumothorax or pleural effusion is noted. Stable bilateral lung opacities are noted consistent with multifocal pneumonia. Bony thorax is unremarkable. IMPRESSION: Stable bilateral lung opacities consistent with multifocal pneumonia. Electronically Signed   By: Lupita Raider M.D.   On: 11/07/2020 16:16   VAS [place] LOWER EXTREMITY VENOUS (DVT)  Result Date: 11/09/2020  Lower Venous DVT Study Other Indications: Covid, D-Dimer. Comparison Study: No previous exam Performing Technologist: Clint Guy RVT  Examination Guidelines: A complete evaluation includes B-mode imaging, spectral Doppler, color Doppler, and power Doppler as needed of all  accessible portions of each vessel. Bilateral testing is considered an integral part of a complete examination. Limited examinations for reoccurring indications may be performed as noted. The reflux portion of the exam is performed with the patient in reverse Trendelenburg.  +---------+---------------+---------+-----------+----------+--------------+ RIGHT    CompressibilityPhasicitySpontaneityPropertiesThrombus Aging +---------+---------------+---------+-----------+----------+--------------+ CFV      Full           Yes      Yes                                 +---------+---------------+---------+-----------+----------+--------------+ SFJ      Full                                                        +---------+---------------+---------+-----------+----------+--------------+ FV Prox  Full                                                        +---------+---------------+---------+-----------+----------+--------------+ FV Mid   Full                                                         +---------+---------------+---------+-----------+----------+--------------+ FV DistalFull                                                        +---------+---------------+---------+-----------+----------+--------------+ PFV      Full                                                        +---------+---------------+---------+-----------+----------+--------------+ POP      Full           Yes      Yes                                 +---------+---------------+---------+-----------+----------+--------------+ PTV      Full                                                        +---------+---------------+---------+-----------+----------+--------------+ PERO     Full                                                        +---------+---------------+---------+-----------+----------+--------------+   +---------+---------------+---------+-----------+----------+--------------+ LEFT  CompressibilityPhasicitySpontaneityPropertiesThrombus Aging +---------+---------------+---------+-----------+----------+--------------+ CFV      Full           Yes      Yes                                 +---------+---------------+---------+-----------+----------+--------------+ SFJ      Full                                                        +---------+---------------+---------+-----------+----------+--------------+ FV Prox  Full                                                        +---------+---------------+---------+-----------+----------+--------------+ FV Mid   Full                                                        +---------+---------------+---------+-----------+----------+--------------+ FV DistalFull                                                        +---------+---------------+---------+-----------+----------+--------------+ PFV      Full                                                         +---------+---------------+---------+-----------+----------+--------------+ POP      Full           Yes      Yes                                 +---------+---------------+---------+-----------+----------+--------------+ PTV      Full                                                        +---------+---------------+---------+-----------+----------+--------------+ PERO     Full                                                        +---------+---------------+---------+-----------+----------+--------------+     Summary: RIGHT: - There is no evidence of deep vein thrombosis in the lower extremity.  - No cystic structure found in the popliteal fossa.  LEFT: - There is no evidence of deep vein thrombosis in the lower extremity.  - No cystic structure found in the  popliteal fossa.  *See table(s) above for measurements and observations. Electronically signed by Fabienne Bruns MD on 11/09/2020 at 7:47:20 PM.    Final

## 2020-11-12 NOTE — Progress Notes (Signed)
Pt educated on self-administering insulin.  Pt demonstrated by giving himself insulin on the abdomen.

## 2020-11-12 NOTE — Progress Notes (Signed)
SATURATION QUALIFICATIONS: (This note is used to comply with regulatory documentation for home oxygen)  Patient Saturations on Room Air at Rest = 85%  Patient Saturations on Room Air while Ambulating = 81%  Patient Saturations on 4 Liters of oxygen while Ambulating = 92%  Please briefly explain why patient needs home oxygen: Pt requires 4L [place] for ambulation and 3L [place] at rest for recovery from mobility to maintain SpO2 >88%    [name], MSOT, OTR/L [facility] Office Number: [phone] Pager: [phone]

## 2020-11-12 NOTE — TOC Initial Note (Signed)
Transition of Care [name]) - Initial/Assessment Note    Patient Details  Name: Benjamin Munoz MRN: [id number] Date of Birth: 1961-02-08  Transition of Care [facility]) CM/SW Contact:    Lockie Pares, RN Phone Number: 11/12/2020, 11:54 AM  Clinical Narrative:                 Day 7 COVID pneumonia, oxygen will be needed at home, ordered and accepted via [employer]. Post hospital appointment made [facility] care center, they are aware that the patient does not have a PCP and will set him up with one. Placed on patient instruction sheet. Plan for DC in AM no HH recommended at this time  Expected Discharge Plan: Home/Self Care Barriers to Discharge: Continued Medical Work up   Patient Goals and CMS Choice        Expected Discharge Plan and Services Expected Discharge Plan: Home/Self Care   Discharge Planning Services: CM Consult   Living arrangements for the past 2 months: Single Family Home                 DME Arranged: Oxygen DME Agency: [facility] [employer] Date DME Agency Contacted: 11/12/20 Time DME Agency Contacted: 1153 Representative spoke with at DME Agency: Shaune Leeks            Prior Living Arrangements/Services Living arrangements for the past 2 months: Single Family Home Lives with:: Spouse Patient language and need for interpreter reviewed:: Yes        Need for Family Participation in Patient Care: Yes (Comment) Care giver support system in place?: Yes (comment)   Criminal Activity/Legal Involvement Pertinent to Current Situation/Hospitalization: No - Comment as needed  Activities of Daily Living Home Assistive Devices/Equipment: None ADL Screening (condition at time of admission) Patient's cognitive ability adequate to safely complete daily activities?: Yes Is the patient deaf or have difficulty hearing?: No Does the patient have difficulty seeing, even when wearing glasses/contacts?: No Does the patient have difficulty  concentrating, remembering, or making decisions?: No Patient able to express need for assistance with ADLs?: Yes Does the patient have difficulty dressing or bathing?: No Independently performs ADLs?: Yes (appropriate for developmental age) Does the patient have difficulty walking or climbing stairs?: No Weakness of Legs: Both Weakness of Arms/Hands: None  Permission Sought/Granted                  Emotional Assessment       Orientation: : Oriented to Self,Oriented to Place,Oriented to  Time,Oriented to Situation Alcohol / Substance Use: Not Applicable    Admission diagnosis:  SOB (shortness of breath) [R06.02] Acute respiratory failure with hypoxia (HCC) [J96.01] Acute hypoxemic respiratory failure due to COVID-19 (HCC) [U07.1, J96.01] COVID [U07.1] Patient Active Problem List   Diagnosis Date Noted  . Thyroid nodule 11/05/2020  . Acute respiratory failure with hypoxia (HCC) 11/05/2020  . Acute hypoxemic respiratory failure due to COVID-19 (HCC) 11/04/2020   PCP:  Oneita Hurt, No Pharmacy:   [id number] [place], [place] - [phone] AT [facility] & [address] [place] [address] Phone: [phone] Fax: [phone]  [facility] #[id number] [place], [place] - [address] AT [facility] [address] [place] [address] Phone: [phone] Fax: [phone]     Social Determinants of Health (SDOH) Interventions    Readmission Risk Interventions No flowsheet data found.

## 2020-11-12 NOTE — Progress Notes (Signed)
Occupational Therapy Treatment Patient Details Name: Benjamin Munoz MRN: [id number] DOB: August 24, 1961 Today's Date: 11/12/2020    History of present illness Pt is a 59 y.o. male seen in ED 11/02/20 with (+) COVID-19, now admitted 11/04/20 with worsening symptoms. Workup for acute hypoxic respiratory failure due to COVID-19 pneumonia. No pertinent PMH.   OT comments  Pt seen for OT follow up session with focus on ADL mobility progression. Pt presented to session requesting to use bathroom. At rest he was on 3L [place]. Pt completed mobility to bathroom in room for toilet transfer on 4L [place] with supervision level of assist for mobility and SpO2 stable. He was independent in his own peri hygiene and grooming post toileting. He was then able to mobilize in the room on 4L [place] with SpO2 >88%. Educated pt on acquiring pulse ox for home to manage SpO2, as well as appropriate energy conservation techniques to implement. He was then able to be titrated back on 3L at rest. Noted improved cognition this session as well, able to recall education from previous sessions. Please see O2 requirement note in chart. MD has been notified. D/c recs remain appropriate, will continue to follow per POC listed below.   Follow Up Recommendations  No OT follow up    Equipment Recommendations  Tub/shower seat    Recommendations for Other Services      Precautions / Restrictions Precautions Precautions: Fall Restrictions Weight Bearing Restrictions: No       Mobility Bed Mobility               General bed mobility comments: up in chair, returned to chair  Transfers Overall transfer level: Independent               General transfer comment: no LOB or external assist needed    Balance Overall balance assessment: No apparent balance deficits (not formally assessed)                                         ADL either performed or assessed with clinical judgement   ADL Overall ADL's :  Modified independent                                       General ADL Comments: Pt able to mobilize to/from bathroom for toilet transfer without external assist (despite OT managing lines) on 4L [place] with SpO2 stable. Continues to require cues for pursed lip breathing. Pt able to complete own peri hygiene. Requires less VC's than previous sessions.     Vision Patient Visual Report: No change from baseline     Perception     Praxis      Cognition Arousal/Alertness: Awake/alert Behavior During Therapy: WFL for tasks assessed/performed Overall Cognitive Status: Within Functional Limits for tasks assessed                                 General Comments: pt cognition improved from previous dates. More clear and able to recall information learned from previous sessions        Exercises     Shoulder Instructions       General Comments      Pertinent Vitals/ Pain       Pain Assessment:  No/denies pain  Home Living                                          Prior Functioning/Environment              Frequency  Min 3X/week        Progress Toward Goals  OT Goals(current goals can now be found in the care plan section)  Progress towards OT goals: Progressing toward goals  Acute Rehab OT Goals Patient Stated Goal: return to independent baseline OT Goal Formulation: With patient Time For Goal Achievement: 11/21/20 Potential to Achieve Goals: Good  Plan Discharge plan remains appropriate    Co-evaluation                 AM-PAC OT "6 Clicks" Daily Activity     Outcome Measure   Help from another person eating meals?: None Help from another person taking care of personal grooming?: None Help from another person toileting, which includes using toliet, bedpan, or urinal?: None Help from another person bathing (including washing, rinsing, drying)?: None Help from another person to put on and taking off regular  upper body clothing?: None Help from another person to put on and taking off regular lower body clothing?: None 6 Click Score: 24    End of Session Equipment Utilized During Treatment: Oxygen  OT Visit Diagnosis: Other abnormalities of gait and mobility (R26.89)   Activity Tolerance Patient tolerated treatment well   Patient Left in chair;with call bell/phone within reach   Nurse Communication Mobility status        Time: 0037-0488 OT Time Calculation (min): 17 min  Charges: OT General Charges $OT Visit: 1 Visit OT Treatments $Self Care/Home Management : 8-22 mins  [name], MSOT, OTR/L Acute Rehabilitation Services [facility] Office Number: [phone] Pager: [phone]  [name] 11/12/2020, 10:31 AM

## 2020-11-13 DIAGNOSIS — E119 Type 2 diabetes mellitus without complications: Secondary | ICD-10-CM

## 2020-11-13 LAB — COMPREHENSIVE METABOLIC PANEL
ALT: 38 U/L (ref 0–44)
AST: 17 U/L (ref 15–41)
Albumin: 2.4 g/dL — ABNORMAL LOW (ref 3.5–5.0)
Alkaline Phosphatase: 47 U/L (ref 38–126)
Anion gap: 9 (ref 5–15)
BUN: 25 mg/dL — ABNORMAL HIGH (ref 6–20)
CO2: 25 mmol/L (ref 22–32)
Calcium: 8.4 mg/dL — ABNORMAL LOW (ref 8.9–10.3)
Chloride: 101 mmol/L (ref 98–111)
Creatinine, Ser: 1.12 mg/dL (ref 0.61–1.24)
GFR, Estimated: >60 mL/min (ref >60)
Glucose, Bld: 105 mg/dL — ABNORMAL HIGH (ref 70–99)
Potassium: 4.6 mmol/L (ref 3.5–5.1)
Sodium: 135 mmol/L (ref 135–145)
Total Bilirubin: 0.8 mg/dL (ref 0.3–1.2)
Total Protein: 5.5 g/dL — ABNORMAL LOW (ref 6.5–8.1)

## 2020-11-13 LAB — CBC
HCT: 41.1 % (ref 39.0–52.0)
Hemoglobin: 14.3 g/dL (ref 13.0–17.0)
MCH: 29.4 pg (ref 26.0–34.0)
MCHC: 34.8 g/dL (ref 30.0–36.0)
MCV: 84.6 fL (ref 80.0–100.0)
Platelets: 431 10*3/uL — ABNORMAL HIGH (ref 150–400)
RBC: 4.86 MIL/uL (ref 4.22–5.81)
RDW: 13.1 % (ref 11.5–15.5)
WBC: 18.9 10*3/uL — ABNORMAL HIGH (ref 4.0–10.5)
nRBC: 0 % (ref 0.0–0.2)

## 2020-11-13 LAB — GLUCOSE, CAPILLARY
Glucose-Capillary: 146 mg/dL — ABNORMAL HIGH (ref 70–99)
Glucose-Capillary: 207 mg/dL — ABNORMAL HIGH (ref 70–99)

## 2020-11-13 LAB — C-REACTIVE PROTEIN: CRP: <0.5 mg/dL (ref <1.0)

## 2020-11-13 LAB — D-DIMER, QUANTITATIVE: D-Dimer, Quant: 0.35 ug/mL-FEU (ref 0.00–0.50)

## 2020-11-13 MED ORDER — PREDNISONE 10 MG PO TABS: ORAL_TABLET | ORAL | 0 refills | Status: DC

## 2020-11-13 MED ORDER — PREDNISONE 20 MG PO TABS
40.0000 mg | ORAL_TABLET | Freq: Every day | ORAL | Status: DC
  Administered 2020-11-13: 08:00:00 40 mg via ORAL
  Filled 2020-11-13: qty 2

## 2020-11-13 MED ORDER — BLOOD GLUCOSE METER KIT: PACK | 0 refills | Status: AC

## 2020-11-13 MED ORDER — METFORMIN HCL 500 MG PO TABS: 500.0000 mg | ORAL_TABLET | Freq: Two times a day (BID) | ORAL | 11 refills | Status: DC

## 2020-11-13 MED ORDER — BENZONATATE 100 MG PO CAPS: 100.0000 mg | ORAL_CAPSULE | Freq: Four times a day (QID) | ORAL | 0 refills | Status: AC | PRN

## 2020-11-13 MED ORDER — ALBUTEROL SULFATE HFA 108 (90 BASE) MCG/ACT IN AERS: 2.0000 | INHALATION_SPRAY | Freq: Four times a day (QID) | RESPIRATORY_TRACT | 0 refills | Status: DC | PRN

## 2020-11-13 MED ORDER — LIVING WELL WITH DIABETES BOOK
Freq: Once | Status: AC
  Filled 2020-11-13: qty 1

## 2020-11-13 NOTE — Progress Notes (Signed)
Pt discharged home.  Discharge instructions explained, pt verbalizes understanding. 

## 2020-11-13 NOTE — Progress Notes (Signed)
Spoke with patient on the phone about new onset diabetes. States that he has never been told that he had prediabetes. No family with diabetes. Reviewed his A1C of 7.1% and normal blood sugar. Reviewed the need to check blood sugars with meter at home and record results. States that he has a follow up appointment on Monday with PCP. Reviewed the plate method for eating. States that he does drink sweet tea and some juices. Discussed cutting back on sugar in tea. States that he tries to stay away from sweets. Patient does contract building work.   Ordered Living Well with Diabetes booklet and attached diabetes Exit Notes to read.   Smith Mince RN BSN CDE Diabetes Coordinator Pager: [phone]  8am-5pm

## 2020-11-13 NOTE — Progress Notes (Signed)
SATURATION QUALIFICATIONS: (This note is used to comply with regulatory documentation for home oxygen)  Patient Saturations on Room Air at Rest = 96%  Patient Saturations on Room Air while Ambulating = 93%  Patient Saturations on n/a Liters of oxygen while Ambulating = n/a  Please briefly explain why patient needs home oxygen: Pt did not require supplemental O2 to maintain SpO2 >/88% with ambulation.  Ina Homes, PT, DPT Acute Rehabilitation Services  Pager [phone] Office [phone]

## 2020-11-13 NOTE — Progress Notes (Signed)
Physical Therapy Treatment & Discharge Patient Details Name: Benjamin Munoz MRN: [id number] DOB: [birth year] Today's Date: 11/13/2020    History of Present Illness Pt is a 59 y.o. male initially seen in ED 11/02/20 with (+) COVID-19, now admitted 11/04/20 with worsening symptoms. Workup for acute hypoxic respiratory failure due to COVID-19 pneumonia. No pertinent PMH.   PT Comments    Pt has significant improved with mobility and cardiopulmonary status. Pt mobilizing independently without DME. SpO2 93-95% on RA with ambulation, significant DOE noted. Pt hopeful for d/c home today, has met short-term acute PT goals. Reviewed educ re: activity recommendations, pulse ox use, importance of mobility. Pt has no further questions or concerns. Will d/c acute PT.    Follow Up Recommendations  No PT follow up     Equipment Recommendations  None recommended by PT    Recommendations for Other Services       Precautions / Restrictions Precautions Precautions: None Restrictions Weight Bearing Restrictions: No    Mobility  Bed Mobility Overal bed mobility: Independent                Transfers Overall transfer level: Independent Equipment used: None Transfers: Sit to/from Stand              Ambulation/Gait Ambulation/Gait assistance: Independent Social research officer, government (Feet): 400 Feet Assistive device: None Gait Pattern/deviations: WFL(Within Functional Limits) Gait velocity: Decreased   General Gait Details: Slow, steady gait independent without DME; SpO2 93-94% on RA, no significant DOE noted. No overt LOB or instability   Stairs             Wheelchair Mobility    Modified Rankin (Stroke Patients Only)       Balance Overall balance assessment: No apparent balance deficits (not formally assessed)                           High level balance activites: Side stepping;Backward walking;Direction changes;Sudden stops;Turns;Head turns High Level Balance  Comments: No overt instability or LOB observed with higher level balance tasks            Cognition Arousal/Alertness: Awake/alert Behavior During Therapy: WFL for tasks assessed/performed Overall Cognitive Status: Within Functional Limits for tasks assessed                                        Exercises      General Comments General comments (skin integrity, edema, etc.): Educ re: current O2 needs, pulse oximeter use (and where to purchase), activity recommendations (including walking program/LE therex), importance of mobility      Pertinent Vitals/Pain Pain Assessment: No/denies pain    Home Living                      Prior Function            PT Goals (current goals can now be found in the care plan section) Progress towards PT goals: Progressing toward goals    Frequency    Min 3X/week      PT Plan Discharge plan needs to be updated    Co-evaluation              AM-PAC PT "6 Clicks" Mobility   Outcome Measure  Help needed turning from your back to your side while in a flat bed without using bedrails?: None Help needed  moving from lying on your back to sitting on the side of a flat bed without using bedrails?: None Help needed moving to and from a bed to a chair (including a wheelchair)?: None Help needed standing up from a chair using your arms (e.g., wheelchair or bedside chair)?: None Help needed to walk in hospital room?: None Help needed climbing 3-5 steps with a railing? : None 6 Click Score: 24    End of Session   Activity Tolerance: Patient tolerated treatment well Patient left: in chair;with call bell/phone within reach Nurse Communication: Mobility status PT Visit Diagnosis: Difficulty in walking, not elsewhere classified (R26.2)     Time: 0018-0970 PT Time Calculation (min) (ACUTE ONLY): 15 min  Charges:  $Therapeutic Exercise: 8-22 mins                     Mabeline Caras, PT, DPT Acute  Rehabilitation Services  Pager [phone] Office [place] 11/13/2020, 9:31 AM

## 2020-11-13 NOTE — Discharge Summary (Signed)
PATIENT DETAILS Name: Benjamin Munoz Age: 59 y.o. Sex: male Date of Birth: 1961-04-09 MRN: [id number]. Admitting Physician: Jonetta Osgood, MD PCP:Pcp, No  Admit Date: 11/04/2020 Discharge date: 11/13/2020  Recommendations for Outpatient Follow-up:  1. Follow up with PCP in 1-2 weeks 2. Please obtain CMP/CBC in one week 3. Repeat Chest Xray in 4-6 week 4. New onset diabetes-please optimize medication management in the outpatient setting. 5. Incidental finding-needs further work-up with thyroid nodule-see below.   Admitted From:  Home  Disposition: [place]: No  Equipment/Devices: None  Discharge Condition: Stable  CODE STATUS: FULL CODE  Diet recommendation:  Diet Order            Diet Carb Modified           Diet Heart Room service appropriate? Yes; Fluid consistency: Thin  Diet effective now                  Brief Narrative: Patient is a 59 y.o. male with no significant PMHx-tested positive for COVID-19 on 12/5-presented to the ED with approximately 5-6-day history of worsening shortness of breath-found of acute hypoxic respiratory failure due to COVID-19 pneumonia.  COVID-19 vaccinated status: Unvaccinated  Significant Events: 12/8>> Admit to [facility] for severe hypoxemia due to COVID-19 pneumonia 12/9>> transitioned to heated high flow from salter high flow due to worsening hypoxemia 12/15>> clinically improving-transition to salter high flow.  Significant studies: 12/9>> CTA chest: No PE, Covid pattern pneumonia. 12/10>> chest x-ray: Diffuse bilateral pulmonary infiltrates. 12/11>> chest x-ray: Stable bilateral lung opacities 12/13>> CTA chest: No PE 12/13>> bilateral lower extremity Doppler: No DVT.  COVID-19 medications: Steroids: 12/8>> Remdesivir: 12/8>> 12/12 Baricitinib: 12/8>>  Antibiotics: Rocephin: 12/8 x 1 Zithromax: 12/8 x 1  Microbiology data: 12/8 >>blood culture:  Negative  Procedures: None  Consults: None  Brief Hospital Course: Acute Hypoxic Resp Failure due to Covid 19 Viral pneumonia: Had severe hypoxemia on initial presentation (requiring heated high flow)-treated with a combination of steroids/Remdesivir and baricitinib-remarkable improvement over the past several days-significant decrease in FiO2 requirements-transitioned to room air yesterday-walked with physical therapy today-does not even require home O2 with O2 sats remaining in the 90s. Taper down prednisone over the next few days-follow with PCP-needs repeat chest x-ray in 4 to 6 weeks to document resolution. If he still has infiltrates on subsequent x-ray chest-needs referral to pulmonologist. Encouraged vaccination-but asked patient to wait a month before he gets a vaccine. Stable for discharge today.  COVID-19 Labs:  Recent Labs    11/11/20 0054 11/12/20 0455 11/13/20 0102  DDIMER 0.39 <0.27 0.35  CRP 0.7 0.6 <0.5    Lab Results  Component Value Date   SARSCOV2NAA POSITIVE (A) 11/01/2020     Transaminitis: Secondary to COVID-19-follow.  AKI: Mild-likely hemodynamically mediated-has resolved  Newly diagnosed DM-2 (A1c 7.1) with uncontrolled hyperglycemia due to steroid use: CBGs much better as steroid dosage gets tapered down-was on Lantus/NovoLog/sliding scale insulin-will be started on Metformin on discharge. Follow with PCP for further optimization  2 cm left thyroid nodule: Seen incidentally on CT chest-stable for outpatient follow-up with PCP.  TSH stable at 0.714.    Obesity Body mass index is 31.03 kg/m.    Discharge Diagnoses:  Principal Problem:   Acute hypoxemic respiratory failure due to COVID-19 [facility] Active Problems:   Thyroid nodule   Acute respiratory failure with hypoxia [facility])   Discharge Instructions:    Person Under Monitoring Name: Benjamin Munoz  Location: [address] [id number]   Infection Prevention  Recommendations for Individuals Confirmed to have, or Being Evaluated for, 2019 Novel Coronavirus (COVID-19) Infection Who Receive Care at Home  Individuals who are confirmed to have, or are being evaluated for, COVID-19 should follow the prevention steps below until a healthcare provider or local or state health department says they can return to normal activities.  Stay home except to get medical care You should restrict activities outside your home, except for getting medical care. Do not go to work, school, or public areas, and do not use public transportation or taxis.  Call ahead before visiting your doctor Before your medical appointment, call the healthcare provider and tell them that you have, or are being evaluated for, COVID-19 infection. This will help the healthcare providers office take steps to keep other people from getting infected. Ask your healthcare provider to call the local or state health department.  Monitor your symptoms Seek prompt medical attention if your illness is worsening (e.g., difficulty breathing). Before going to your medical appointment, call the healthcare provider and tell them that you have, or are being evaluated for, COVID-19 infection. Ask your healthcare provider to call the local or state health department.  Wear a facemask You should wear a facemask that covers your nose and mouth when you are in the same room with other people and when you visit a healthcare provider. People who live with or visit you should also wear a facemask while they are in the same room with you.  Separate yourself from other people in your home As much as possible, you should stay in a different room from other people in your home. Also, you should use a separate bathroom, if available.  Avoid sharing household items You should not share dishes, drinking glasses, cups, eating utensils, towels, bedding, or other items with other people in your home. After using  these items, you should wash them thoroughly with soap and water.  Cover your coughs and sneezes Cover your mouth and nose with a tissue when you cough or sneeze, or you can cough or sneeze into your sleeve. Throw used tissues in a lined trash can, and immediately wash your hands with soap and water for at least 20 seconds or use an alcohol-based hand rub.  Wash your [facility] your hands often and thoroughly with soap and water for at least 20 seconds. You can use an alcohol-based hand sanitizer if soap and water are not available and if your hands are not visibly dirty. Avoid touching your eyes, nose, and mouth with unwashed hands.   Prevention Steps for Caregivers and Household Members of Individuals Confirmed to have, or Being Evaluated for, COVID-19 Infection Being Cared for in the Home  If you live with, or provide care at home for, a person confirmed to have, or being evaluated for, COVID-19 infection please follow these guidelines to prevent infection:  Follow healthcare providers instructions Make sure that you understand and can help the patient follow any healthcare provider instructions for all care.  Provide for the patients basic needs You should help the patient with basic needs in the home and provide support for getting groceries, prescriptions, and other personal needs.  Monitor the patients symptoms If they are getting sicker, call his or her medical provider and tell them that the patient has, or is being evaluated for, COVID-19 infection. This will help the healthcare providers office take steps to keep other people from getting infected. Ask the  healthcare provider to call the local or state health department.  Limit the number of people who have contact with the patient  If possible, have only one caregiver for the patient.  Other household members should stay in another home or place of residence. If this is not possible, they should stay  in another  room, or be separated from the patient as much as possible. Use a separate bathroom, if available.  Restrict visitors who do not have an essential need to be in the home.  Keep older adults, very young children, and other sick people away from the patient Keep older adults, very young children, and those who have compromised immune systems or chronic health conditions away from the patient. This includes people with chronic heart, lung, or kidney conditions, diabetes, and cancer.  Ensure good ventilation Make sure that shared spaces in the home have good air flow, such as from an air conditioner or an opened window, weather permitting.  Wash your hands often  Wash your hands often and thoroughly with soap and water for at least 20 seconds. You can use an alcohol based hand sanitizer if soap and water are not available and if your hands are not visibly dirty.  Avoid touching your eyes, nose, and mouth with unwashed hands.  Use disposable paper towels to dry your hands. If not available, use dedicated cloth towels and replace them when they become wet.  Wear a facemask and gloves  Wear a disposable facemask at all times in the room and gloves when you touch or have contact with the patients blood, body fluids, and/or secretions or excretions, such as sweat, saliva, sputum, nasal mucus, vomit, urine, or feces.  Ensure the mask fits over your nose and mouth tightly, and do not touch it during use.  Throw out disposable facemasks and gloves after using them. Do not reuse.  Wash your hands immediately after removing your facemask and gloves.  If your personal clothing becomes contaminated, carefully remove clothing and launder. Wash your hands after handling contaminated clothing.  Place all used disposable facemasks, gloves, and other waste in a lined container before disposing them with other household waste.  Remove gloves and wash your hands immediately after handling these items.  Do  not share dishes, glasses, or other household items with the patient  Avoid sharing household items. You should not share dishes, drinking glasses, cups, eating utensils, towels, bedding, or other items with a patient who is confirmed to have, or being evaluated for, COVID-19 infection.  After the person uses these items, you should wash them thoroughly with soap and water.  Wash laundry thoroughly  Immediately remove and wash clothes or bedding that have blood, body fluids, and/or secretions or excretions, such as sweat, saliva, sputum, nasal mucus, vomit, urine, or feces, on them.  Wear gloves when handling laundry from the patient.  Read and follow directions on labels of laundry or clothing items and detergent. In general, wash and dry with the warmest temperatures recommended on the label.  Clean all areas the individual has used often  Clean all touchable surfaces, such as counters, tabletops, doorknobs, bathroom fixtures, toilets, phones, keyboards, tablets, and bedside tables, every day. Also, clean any surfaces that may have blood, body fluids, and/or secretions or excretions on them.  Wear gloves when cleaning surfaces the patient has come in contact with.  Use a diluted bleach solution (e.g., dilute bleach with 1 part bleach and 10 parts water) or a household disinfectant with a  label that says EPA-registered for coronaviruses. To make a bleach solution at home, add 1 tablespoon of bleach to 1 quart (4 cups) of water. For a larger supply, add  cup of bleach to 1 gallon (16 cups) of water.  Read labels of cleaning products and follow recommendations provided on product labels. Labels contain instructions for safe and effective use of the cleaning product including precautions you should take when applying the product, such as wearing gloves or eye protection and making sure you have good ventilation during use of the product.  Remove gloves and wash hands immediately after  cleaning.  Monitor yourself for signs and symptoms of illness Caregivers and household members are considered close contacts, should monitor their health, and will be asked to limit movement outside of the home to the extent possible. Follow the monitoring steps for close contacts listed on the symptom monitoring form.   ? If you have additional questions, contact your local health department or call the epidemiologist on call at [phone] (available 24/7). ? This guidance is subject to change. For the most up-to-date guidance from [facility], please refer to their website: YouBlogs.pl    Activity:  As tolerated  Discharge Instructions    Call MD for:  difficulty breathing, headache or visual disturbances   Complete by: As directed    Diet Carb Modified   Complete by: As directed    Discharge instructions   Complete by: As directed    Follow with Primary MD in 1-2 weeks  Please get a complete blood count and chemistry panel checked by your Primary MD at your next visit, and again as instructed by your Primary MD.  Get Medicines reviewed and adjusted: Please take all your medications with you for your next visit with your Primary MD  Laboratory/radiological data: Please request your Primary MD to go over all hospital tests and procedure/radiological results at the follow up, please ask your Primary MD to get all Hospital records sent to his/her office.  In some cases, they will be blood work, cultures and biopsy results pending at the time of your discharge. Please request that your primary care M.D. follows up on these results.  Also Note the following: If you experience worsening of your admission symptoms, develop shortness of breath, life threatening emergency, suicidal or homicidal thoughts you must seek medical attention immediately by calling 911 or calling your MD immediately  if symptoms less severe.  You must  read complete instructions/literature along with all the possible adverse reactions/side effects for all the Medicines you take and that have been prescribed to you. Take any new Medicines after you have completely understood and accpet all the possible adverse reactions/side effects.   Do not drive when taking Pain medications or sleeping medications (Benzodaizepines)  Do not take more than prescribed Pain, Sleep and Anxiety Medications. It is not advisable to combine anxiety,sleep and pain medications without talking with your primary care practitioner  Special Instructions: If you have smoked or chewed Tobacco  in the last 2 yrs please stop smoking, stop any regular Alcohol  and or any Recreational drug use.  Wear Seat belts while driving.  Please note: You were cared for by a hospitalist during your hospital stay. Once you are discharged, your primary care physician will handle any further medical issues. Please note that NO REFILLS for any discharge medications will be authorized once you are discharged, as it is imperative that you return to your primary care physician (or establish a relationship with  a primary care physician if you do not have one) for your post hospital discharge needs so that they can reassess your need for medications and monitor your lab values.   1.) 21 days of isolation from 11/01/2020 2.) You have been diagnosed with diabetes-please check your sugars multiple times a day-keep a record of these readings-take it to your next appointment with your primary care practitioner. 3.) You have a thyroid nodule-please ask your primary care practitioner to initiate further work-up. In some cases these nodules can turn cancerous. 4.) Sometime in early [place] should proceed and get yourself the full course of COVID-19 vaccine. 5.) Please ask your primary care practitioner to repeat a two-view chest x-ray in 4 to 6 weeks. 6.) Please seek immediate medical attention if your  shortness of breath worsens.   Increase activity slowly   Complete by: As directed      Allergies as of 11/13/2020   No Known Allergies     Medication List    TAKE these medications   acetaminophen 325 MG tablet Commonly known as: TYLENOL Take 650 mg by mouth every 6 (six) hours as needed for mild pain, fever or headache.   albuterol 108 (90 Base) MCG/ACT inhaler Commonly known as: VENTOLIN HFA Inhale 2 puffs into the lungs every 6 (six) hours as needed for wheezing or shortness of breath.   benzonatate 100 MG capsule Commonly known as: Tessalon Perles Take 1 capsule (100 mg total) by mouth every 6 (six) hours as needed for cough.   blood glucose meter kit and supplies Dispense based on patient and insurance preference. Use up to four times daily as directed. (FOR ICD-10 E10.9, E11.9).   metFORMIN 500 MG tablet Commonly known as: Glucophage Take 1 tablet (500 mg total) by mouth 2 (two) times daily with a meal.   predniSONE 10 MG tablet Commonly known as: DELTASONE Take 40 mg daily for 1 day, 30 mg daily for 1 day, 20 mg daily for 1 days,10 mg daily for 1 day, then stop       Follow-up Information    [facility] Follow up on 11/25/2020.   Why: 1:30pm   Please go to this appointment, they will also refer you to a primary care doctor. If you cannot make this appointment date please call to reschedule.  Contact information: [place] [address] [phone]             No Known Allergies   Other Procedures/Studies: CT ANGIO CHEST PE W OR WO CONTRAST  Result Date: 11/09/2020 CLINICAL DATA:  COVID-19.  Shortness of breath EXAM: CT ANGIOGRAPHY CHEST WITH CONTRAST TECHNIQUE: Multidetector CT imaging of the chest was performed using the standard protocol during bolus administration of intravenous contrast. Multiplanar CT image reconstructions and MIPs were obtained to evaluate the vascular anatomy. CONTRAST:  18mL  OMNIPAQUE IOHEXOL 350 MG/ML SOLN COMPARISON:  11/05/2020 FINDINGS: Cardiovascular: Contrast injection is sufficient to demonstrate satisfactory opacification of the pulmonary arteries to the segmental level. There is no pulmonary embolus or evidence of right heart strain. The size of the main pulmonary artery is normal. Heart size is normal, with no pericardial effusion. The course and caliber of the aorta are normal. There is no atherosclerotic calcification. Opacification decreased due to pulmonary arterial phase contrast bolus timing. Mediastinum/Nodes: No mediastinal, hilar or axillary lymphadenopathy. 2 cm left thyroid nodule. Thoracic esophageal course is normal. Lungs/Pleura: Multifocal, peripheral predominant ground glass opacities. No pleural effusion. Upper Abdomen: Contrast  bolus timing is not optimized for evaluation of the abdominal organs. The visualized portions of the organs of the upper abdomen are normal. Musculoskeletal: No chest wall abnormality. No bony spinal canal stenosis. Review of the MIP images confirms the above findings. IMPRESSION: 1. No pulmonary embolus or acute aortic syndrome. 2. Unchanged pattern of COVID-19 pneumonia. 3. 2 cm left thyroid nodule. Recommend thyroid US (ref: J Am Coll Radiol. 2015 Feb;12(2): 143-50). Electronically Signed   By: Ulyses Jarred M.D.   On: 11/09/2020 23:30   CT Angio Chest PE W and/or Wo Contrast  Result Date: 11/05/2020 CLINICAL DATA:  COVID positive.  Shortness of breath EXAM: CT ANGIOGRAPHY CHEST WITH CONTRAST TECHNIQUE: Multidetector CT imaging of the chest was performed using the standard protocol during bolus administration of intravenous contrast. Multiplanar CT image reconstructions and MIPs were obtained to evaluate the vascular anatomy. CONTRAST:  113mL OMNIPAQUE IOHEXOL 350 MG/ML SOLN COMPARISON:  Chest x-ray from yesterday and 4 days ago FINDINGS: Cardiovascular: Satisfactory opacification of the pulmonary arteries to the segmental  level. No evidence of pulmonary embolism when allowing for levels of motion artifact. Normal heart size. No pericardial effusion. Mediastinum/Nodes: 2 cm left thyroid nodule. No adenopathy in the mediastinum. Lungs/Pleura: Patchy ground-glass opacity throughout all lobes with intervening normal lung. Lung volumes are low. No visible effusion or pneumothorax. Upper Abdomen: There could be hepatic steatosis, but certainty is limited by arterial timing. Musculoskeletal: Spondylosis. Review of the MIP images confirms the above findings. IMPRESSION: 1. COVID pattern pneumonia. 2. Motion degraded chest CTA with no evidence of pulmonary embolism. 3. 2 cm left thyroid nodule. Recommend thyroid US.(Ref: J Am Coll Radiol. 2015 Feb;12(2): 143-50). Electronically Signed   By: Monte Fantasia M.D.   On: 11/05/2020 05:09   DG Chest Port 1 View  Result Date: 11/06/2020 CLINICAL DATA:  Shortness of breath.  COVID. EXAM: PORTABLE CHEST 1 VIEW COMPARISON:  CT 11/05/2020.  Chest x-ray 11/04/2020. FINDINGS: Heart size stable. Diffuse bilateral pulmonary infiltrates/edema. Similar findings noted on prior exam. No pleural effusion or pneumothorax. Degenerative change thoracic spine. IMPRESSION: Diffuse bilateral pulmonary infiltrates/edema. Similar findings on prior exam. Findings consistent with COVID pneumonia. Electronically Signed   By: Marcello Moores  Register   On: 11/06/2020 08:01   DG Chest Port 1 View  Result Date: 11/04/2020 CLINICAL DATA:  59 year old male positive COVID-19.  Hypoxia. EXAM: PORTABLE CHEST 1 VIEW COMPARISON:  Portable chest 11/01/2020. FINDINGS: Portable AP semi upright view at 2016 hours. Mildly lower lung volumes and moderately progressed Patchy and confluent bilateral mid and lower lung opacity. Opacity is somewhat more extensive on the right now including some right upper lung involvement. The left upper lung remains spared. No pneumothorax. No pleural effusion is evident. Stable cardiac size and  mediastinal contours. Paucity of bowel gas in the upper abdomen No acute osseous abnormality identified. Visualized tracheal air column is within normal limits. IMPRESSION: Progressed bilateral COVID-19 pneumonia. Electronically Signed   By: Genevie Ann M.D.   On: 11/04/2020 20:34   DG Chest Portable 1 View  Result Date: 11/01/2020 CLINICAL DATA:  Fever, chills, body aches. EXAM: PORTABLE CHEST 1 VIEW COMPARISON:  None. FINDINGS: Borderline cardiomegaly. Subtle patchy ground-glass opacities bilaterally. No pleural effusion or pneumothorax is seen. Osseous structures about the chest are unremarkable. IMPRESSION: 1. Subtle patchy ground-glass opacities bilaterally, suspicious for multifocal pneumonia. 2. Borderline cardiomegaly. Electronically Signed   By: Franki Cabot M.D.   On: 11/01/2020 10:33   DG Chest Port 1V same Day  Result Date:  11/07/2020 CLINICAL DATA:  Shortness of breath. EXAM: PORTABLE CHEST 1 VIEW COMPARISON:  November 06, 2020. FINDINGS: Stable cardiomediastinal silhouette. No pneumothorax or pleural effusion is noted. Stable bilateral lung opacities are noted consistent with multifocal pneumonia. Bony thorax is unremarkable. IMPRESSION: Stable bilateral lung opacities consistent with multifocal pneumonia. Electronically Signed   By: Marijo Conception M.D.   On: 11/07/2020 16:16   VAS [place] LOWER EXTREMITY VENOUS (DVT)  Result Date: 11/09/2020  Lower Venous DVT Study Other Indications: Covid, D-Dimer. Comparison Study: No previous exam Performing Technologist: Vonzell Schlatter RVT  Examination Guidelines: A complete evaluation includes B-mode imaging, spectral Doppler, color Doppler, and power Doppler as needed of all accessible portions of each vessel. Bilateral testing is considered an integral part of a complete examination. Limited examinations for reoccurring indications may be performed as noted. The reflux portion of the exam is performed with the patient in reverse Trendelenburg.   +---------+---------------+---------+-----------+----------+--------------+  RIGHT     Compressibility Phasicity Spontaneity Properties Thrombus Aging  +---------+---------------+---------+-----------+----------+--------------+  CFV       Full            Yes       Yes                                    +---------+---------------+---------+-----------+----------+--------------+  SFJ       Full                                                             +---------+---------------+---------+-----------+----------+--------------+  FV Prox   Full                                                             +---------+---------------+---------+-----------+----------+--------------+  FV Mid    Full                                                             +---------+---------------+---------+-----------+----------+--------------+  FV Distal Full                                                             +---------+---------------+---------+-----------+----------+--------------+  PFV       Full                                                             +---------+---------------+---------+-----------+----------+--------------+  POP       Full            Yes  Yes                                    +---------+---------------+---------+-----------+----------+--------------+  PTV       Full                                                             +---------+---------------+---------+-----------+----------+--------------+  PERO      Full                                                             +---------+---------------+---------+-----------+----------+--------------+   +---------+---------------+---------+-----------+----------+--------------+  LEFT      Compressibility Phasicity Spontaneity Properties Thrombus Aging  +---------+---------------+---------+-----------+----------+--------------+  CFV       Full            Yes       Yes                                     +---------+---------------+---------+-----------+----------+--------------+  SFJ       Full                                                             +---------+---------------+---------+-----------+----------+--------------+  FV Prox   Full                                                             +---------+---------------+---------+-----------+----------+--------------+  FV Mid    Full                                                             +---------+---------------+---------+-----------+----------+--------------+  FV Distal Full                                                             +---------+---------------+---------+-----------+----------+--------------+  PFV       Full                                                             +---------+---------------+---------+-----------+----------+--------------+  POP       Full            Yes       Yes                                    +---------+---------------+---------+-----------+----------+--------------+  PTV       Full                                                             +---------+---------------+---------+-----------+----------+--------------+  PERO      Full                                                             +---------+---------------+---------+-----------+----------+--------------+     Summary: RIGHT: - There is no evidence of deep vein thrombosis in the lower extremity.  - No cystic structure found in the popliteal fossa.  LEFT: - There is no evidence of deep vein thrombosis in the lower extremity.  - No cystic structure found in the popliteal fossa.  *See table(s) above for measurements and observations. Electronically signed by Ruta Hinds MD on 11/09/2020 at 7:47:20 PM.    Final      TODAY-DAY OF DISCHARGE:  Subjective:   Genevie Ann today has no headache,no chest abdominal pain,no new weakness tingling or numbness, feels much better wants to go home today.  Objective:   Blood pressure 123/80, pulse 79,  temperature 98.2 F (36.8 C), temperature source Oral, resp. rate 19, height $RemoveBe'5\' 9"'ZzOCggScr$  (1.753 m), weight 95.3 kg, SpO2 98 %.  Intake/Output Summary (Last 24 hours) at 11/13/2020 1001 Last data filed at 11/13/2020 0756 Gross per 24 hour  Intake 440 ml  Output 1600 ml  Net -1160 ml   Filed Weights   11/04/20 2226  Weight: 95.3 kg    Exam: Awake Alert, Oriented *3, No new F.N deficits, Normal affect [place].AT,PERRAL Supple Neck,No JVD, No cervical lymphadenopathy appriciated.  Symmetrical Chest wall movement, Good air movement bilaterally, CTAB RRR,No Gallops,Rubs or new Murmurs, No Parasternal Heave +ve B.Sounds, Abd Soft, Non tender, No organomegaly appriciated, No rebound -guarding or rigidity. No Cyanosis, Clubbing or edema, No new Rash or bruise   PERTINENT RADIOLOGIC STUDIES: CT ANGIO CHEST PE W OR WO CONTRAST  Result Date: 11/09/2020 CLINICAL DATA:  COVID-19.  Shortness of breath EXAM: CT ANGIOGRAPHY CHEST WITH CONTRAST TECHNIQUE: Multidetector CT imaging of the chest was performed using the standard protocol during bolus administration of intravenous contrast. Multiplanar CT image reconstructions and MIPs were obtained to evaluate the vascular anatomy. CONTRAST:  163mL OMNIPAQUE IOHEXOL 350 MG/ML SOLN COMPARISON:  11/05/2020 FINDINGS: Cardiovascular: Contrast injection is sufficient to demonstrate satisfactory opacification of the pulmonary arteries to the segmental level. There is no pulmonary embolus or evidence of right heart strain. The size of the main pulmonary artery is normal. Heart size is normal, with no pericardial effusion. The course and caliber of the aorta are normal. There is no atherosclerotic calcification. Opacification decreased due to pulmonary arterial phase contrast bolus timing. Mediastinum/Nodes: No mediastinal, hilar  or axillary lymphadenopathy. 2 cm left thyroid nodule. Thoracic esophageal course is normal. Lungs/Pleura: Multifocal, peripheral predominant ground  glass opacities. No pleural effusion. Upper Abdomen: Contrast bolus timing is not optimized for evaluation of the abdominal organs. The visualized portions of the organs of the upper abdomen are normal. Musculoskeletal: No chest wall abnormality. No bony spinal canal stenosis. Review of the MIP images confirms the above findings. IMPRESSION: 1. No pulmonary embolus or acute aortic syndrome. 2. Unchanged pattern of COVID-19 pneumonia. 3. 2 cm left thyroid nodule. Recommend thyroid US (ref: J Am Coll Radiol. 2015 Feb;12(2): 143-50). Electronically Signed   By: Ulyses Jarred M.D.   On: 11/09/2020 23:30   CT Angio Chest PE W and/or Wo Contrast  Result Date: 11/05/2020 CLINICAL DATA:  COVID positive.  Shortness of breath EXAM: CT ANGIOGRAPHY CHEST WITH CONTRAST TECHNIQUE: Multidetector CT imaging of the chest was performed using the standard protocol during bolus administration of intravenous contrast. Multiplanar CT image reconstructions and MIPs were obtained to evaluate the vascular anatomy. CONTRAST:  11mL OMNIPAQUE IOHEXOL 350 MG/ML SOLN COMPARISON:  Chest x-ray from yesterday and 4 days ago FINDINGS: Cardiovascular: Satisfactory opacification of the pulmonary arteries to the segmental level. No evidence of pulmonary embolism when allowing for levels of motion artifact. Normal heart size. No pericardial effusion. Mediastinum/Nodes: 2 cm left thyroid nodule. No adenopathy in the mediastinum. Lungs/Pleura: Patchy ground-glass opacity throughout all lobes with intervening normal lung. Lung volumes are low. No visible effusion or pneumothorax. Upper Abdomen: There could be hepatic steatosis, but certainty is limited by arterial timing. Musculoskeletal: Spondylosis. Review of the MIP images confirms the above findings. IMPRESSION: 1. COVID pattern pneumonia. 2. Motion degraded chest CTA with no evidence of pulmonary embolism. 3. 2 cm left thyroid nodule. Recommend thyroid US.(Ref: J Am Coll Radiol. 2015 Feb;12(2):  143-50). Electronically Signed   By: Monte Fantasia M.D.   On: 11/05/2020 05:09   DG Chest Port 1 View  Result Date: 11/06/2020 CLINICAL DATA:  Shortness of breath.  COVID. EXAM: PORTABLE CHEST 1 VIEW COMPARISON:  CT 11/05/2020.  Chest x-ray 11/04/2020. FINDINGS: Heart size stable. Diffuse bilateral pulmonary infiltrates/edema. Similar findings noted on prior exam. No pleural effusion or pneumothorax. Degenerative change thoracic spine. IMPRESSION: Diffuse bilateral pulmonary infiltrates/edema. Similar findings on prior exam. Findings consistent with COVID pneumonia. Electronically Signed   By: Marcello Moores  Register   On: 11/06/2020 08:01   DG Chest Port 1 View  Result Date: 11/04/2020 CLINICAL DATA:  59 year old male positive COVID-19.  Hypoxia. EXAM: PORTABLE CHEST 1 VIEW COMPARISON:  Portable chest 11/01/2020. FINDINGS: Portable AP semi upright view at 2016 hours. Mildly lower lung volumes and moderately progressed Patchy and confluent bilateral mid and lower lung opacity. Opacity is somewhat more extensive on the right now including some right upper lung involvement. The left upper lung remains spared. No pneumothorax. No pleural effusion is evident. Stable cardiac size and mediastinal contours. Paucity of bowel gas in the upper abdomen No acute osseous abnormality identified. Visualized tracheal air column is within normal limits. IMPRESSION: Progressed bilateral COVID-19 pneumonia. Electronically Signed   By: Genevie Ann M.D.   On: 11/04/2020 20:34   DG Chest Portable 1 View  Result Date: 11/01/2020 CLINICAL DATA:  Fever, chills, body aches. EXAM: PORTABLE CHEST 1 VIEW COMPARISON:  None. FINDINGS: Borderline cardiomegaly. Subtle patchy ground-glass opacities bilaterally. No pleural effusion or pneumothorax is seen. Osseous structures about the chest are unremarkable. IMPRESSION: 1. Subtle patchy ground-glass opacities bilaterally, suspicious for multifocal pneumonia. 2. Borderline  cardiomegaly.  Electronically Signed   By: Franki Cabot M.D.   On: 11/01/2020 10:33   DG Chest Port 1V same Day  Result Date: 11/07/2020 CLINICAL DATA:  Shortness of breath. EXAM: PORTABLE CHEST 1 VIEW COMPARISON:  November 06, 2020. FINDINGS: Stable cardiomediastinal silhouette. No pneumothorax or pleural effusion is noted. Stable bilateral lung opacities are noted consistent with multifocal pneumonia. Bony thorax is unremarkable. IMPRESSION: Stable bilateral lung opacities consistent with multifocal pneumonia. Electronically Signed   By: Marijo Conception M.D.   On: 11/07/2020 16:16   VAS [place] LOWER EXTREMITY VENOUS (DVT)  Result Date: 11/09/2020  Lower Venous DVT Study Other Indications: Covid, D-Dimer. Comparison Study: No previous exam Performing Technologist: Vonzell Schlatter RVT  Examination Guidelines: A complete evaluation includes B-mode imaging, spectral Doppler, color Doppler, and power Doppler as needed of all accessible portions of each vessel. Bilateral testing is considered an integral part of a complete examination. Limited examinations for reoccurring indications may be performed as noted. The reflux portion of the exam is performed with the patient in reverse Trendelenburg.  +---------+---------------+---------+-----------+----------+--------------+  RIGHT     Compressibility Phasicity Spontaneity Properties Thrombus Aging  +---------+---------------+---------+-----------+----------+--------------+  CFV       Full            Yes       Yes                                    +---------+---------------+---------+-----------+----------+--------------+  SFJ       Full                                                             +---------+---------------+---------+-----------+----------+--------------+  FV Prox   Full                                                             +---------+---------------+---------+-----------+----------+--------------+  FV Mid    Full                                                              +---------+---------------+---------+-----------+----------+--------------+  FV Distal Full                                                             +---------+---------------+---------+-----------+----------+--------------+  PFV       Full                                                             +---------+---------------+---------+-----------+----------+--------------+  POP       Full            Yes       Yes                                    +---------+---------------+---------+-----------+----------+--------------+  PTV       Full                                                             +---------+---------------+---------+-----------+----------+--------------+  PERO      Full                                                             +---------+---------------+---------+-----------+----------+--------------+   +---------+---------------+---------+-----------+----------+--------------+  LEFT      Compressibility Phasicity Spontaneity Properties Thrombus Aging  +---------+---------------+---------+-----------+----------+--------------+  CFV       Full            Yes       Yes                                    +---------+---------------+---------+-----------+----------+--------------+  SFJ       Full                                                             +---------+---------------+---------+-----------+----------+--------------+  FV Prox   Full                                                             +---------+---------------+---------+-----------+----------+--------------+  FV Mid    Full                                                             +---------+---------------+---------+-----------+----------+--------------+  FV Distal Full                                                             +---------+---------------+---------+-----------+----------+--------------+  PFV       Full                                                              +---------+---------------+---------+-----------+----------+--------------+  POP       Full            Yes       Yes                                    +---------+---------------+---------+-----------+----------+--------------+  PTV       Full                                                             +---------+---------------+---------+-----------+----------+--------------+  PERO      Full                                                             +---------+---------------+---------+-----------+----------+--------------+     Summary: RIGHT: - There is no evidence of deep vein thrombosis in the lower extremity.  - No cystic structure found in the popliteal fossa.  LEFT: - There is no evidence of deep vein thrombosis in the lower extremity.  - No cystic structure found in the popliteal fossa.  *See table(s) above for measurements and observations. Electronically signed by Ruta Hinds MD on 11/09/2020 at 7:47:20 PM.    Final      PERTINENT LAB RESULTS: CBC: Recent Labs    11/12/20 0455 11/13/20 0102  WBC 20.1* 18.9*  HGB 14.4 14.3  HCT 42.7 41.1  PLT 367 431*   CMET CMP     Component Value Date/Time   NA 135 11/13/2020 0102   K 4.6 11/13/2020 0102   CL 101 11/13/2020 0102   CO2 25 11/13/2020 0102   GLUCOSE 105 (H) 11/13/2020 0102   BUN 25 (H) 11/13/2020 0102   CREATININE 1.12 11/13/2020 0102   CALCIUM 8.4 (L) 11/13/2020 0102   PROT 5.5 (L) 11/13/2020 0102   ALBUMIN 2.4 (L) 11/13/2020 0102   AST 17 11/13/2020 0102   ALT 38 11/13/2020 0102   ALKPHOS 47 11/13/2020 0102   BILITOT 0.8 11/13/2020 0102   GFRNONAA >60 11/13/2020 0102    GFR Estimated Creatinine Clearance: 80.9 mL/min (by C-G formula based on SCr of 1.12 mg/dL). No results for input(s): LIPASE, AMYLASE in the last 72 hours. No results for input(s): CKTOTAL, CKMB, CKMBINDEX, TROPONINI in the last 72 hours. Invalid input(s): POCBNP Recent Labs    11/12/20 0455 11/13/20 0102  DDIMER <0.27 0.35   No results for  input(s): HGBA1C in the last 72 hours. No results for input(s): CHOL, HDL, LDLCALC, TRIG, CHOLHDL, LDLDIRECT in the last 72 hours. No results for input(s): TSH, T4TOTAL, T3FREE, THYROIDAB in the last 72 hours.  Invalid input(s): FREET3 No results for input(s): VITAMINB12, FOLATE, FERRITIN, TIBC, IRON, RETICCTPCT in the last 72 hours. Coags: No results for input(s): INR in the last 72 hours.  Invalid input(s): PT Microbiology: Recent Results (from the past 240 hour(s))  Blood Culture (routine x 2)     Status: None   Collection Time: 11/04/20  8:52 PM   Specimen: BLOOD  Result Value Ref Range Status   Specimen Description BLOOD LEFT ANTECUBITAL  Final  Special Requests   Final    BOTTLES DRAWN AEROBIC AND ANAEROBIC Blood Culture results may not be optimal due to an inadequate volume of blood received in culture bottles   Culture   Final    NO GROWTH 5 DAYS Performed at [facility], [place] [address], [place], [place] [address]    Report Status 11/09/2020 FINAL  Final  Blood Culture (routine x 2)     Status: None   Collection Time: 11/04/20  8:55 PM   Specimen: BLOOD  Result Value Ref Range Status   Specimen Description BLOOD RIGHT ANTECUBITAL  Final   Special Requests   Final    BOTTLES DRAWN AEROBIC AND ANAEROBIC Blood Culture adequate volume   Culture   Final    NO GROWTH 5 DAYS Performed at [facility], [place] [address], [place],  [address]    Report Status 11/09/2020 FINAL  Final    FURTHER DISCHARGE INSTRUCTIONS:  Get Medicines reviewed and adjusted: Please take all your medications with you for your next visit with your Primary MD  Laboratory/radiological data: Please request your Primary MD to go over all hospital tests and procedure/radiological results at the follow up, please ask your Primary MD to get all Hospital records sent to his/her office.  In some cases, they will be blood work, cultures and biopsy results pending at the time of your  discharge. Please request that your primary care M.D. goes through all the records of your hospital data and follows up on these results.  Also Note the following: If you experience worsening of your admission symptoms, develop shortness of breath, life threatening emergency, suicidal or homicidal thoughts you must seek medical attention immediately by calling 911 or calling your MD immediately  if symptoms less severe.  You must read complete instructions/literature along with all the possible adverse reactions/side effects for all the Medicines you take and that have been prescribed to you. Take any new Medicines after you have completely understood and accpet all the possible adverse reactions/side effects.   Do not drive when taking Pain medications or sleeping medications (Benzodaizepines)  Do not take more than prescribed Pain, Sleep and Anxiety Medications. It is not advisable to combine anxiety,sleep and pain medications without talking with your primary care practitioner  Special Instructions: If you have smoked or chewed Tobacco  in the last 2 yrs please stop smoking, stop any regular Alcohol  and or any Recreational drug use.  Wear Seat belts while driving.  Please note: You were cared for by a hospitalist during your hospital stay. Once you are discharged, your primary care physician will handle any further medical issues. Please note that NO REFILLS for any discharge medications will be authorized once you are discharged, as it is imperative that you return to your primary care physician (or establish a relationship with a primary care physician if you do not have one) for your post hospital discharge needs so that they can reassess your need for medications and monitor your lab values.  Total Time spent coordinating discharge including counseling, education and face to face time equals 35 minutes.  SignedOren Binet 11/13/2020 10:01 AM

## 2020-11-13 NOTE — Discharge Instructions (Signed)
covid    Person Under Monitoring Name: Benjamin Munoz  Location: [address] [place] [address]   Infection Prevention Recommendations for Individuals Confirmed to have, or Being Evaluated for, 2019 Novel Coronavirus (COVID-19) Infection Who Receive Care at Home  Individuals who are confirmed to have, or are being evaluated for, COVID-19 should follow the prevention steps below until a healthcare provider or local or state health department says they can return to normal activities.  Stay home except to get medical care You should restrict activities outside your home, except for getting medical care. Do not go to work, school, or public areas, and do not use public transportation or taxis.  Call ahead before visiting your doctor Before your medical appointment, call the healthcare provider and tell them that you have, or are being evaluated for, COVID-19 infection. This will help the healthcare providers office take steps to keep other people from getting infected. Ask your healthcare provider to call the local or state health department.  Monitor your symptoms Seek prompt medical attention if your illness is worsening (e.g., difficulty breathing). Before going to your medical appointment, call the healthcare provider and tell them that you have, or are being evaluated for, COVID-19 infection. Ask your healthcare provider to call the local or state health department.  Wear a facemask You should wear a facemask that covers your nose and mouth when you are in the same room with other people and when you visit a healthcare provider. People who live with or visit you should also wear a facemask while they are in the same room with you.  Separate yourself from other people in your home As much as possible, you should stay in a different room from other people in your home. Also, you should use a separate bathroom, if available.  Avoid sharing household items You  should not share dishes, drinking glasses, cups, eating utensils, towels, bedding, or other items with other people in your home. After using these items, you should wash them thoroughly with soap and water.  Cover your coughs and sneezes Cover your mouth and nose with a tissue when you cough or sneeze, or you can cough or sneeze into your sleeve. Throw used tissues in a lined trash can, and immediately wash your hands with soap and water for at least 20 seconds or use an alcohol-based hand rub.  Wash your [employer] your hands often and thoroughly with soap and water for at least 20 seconds. You can use an alcohol-based hand sanitizer if soap and water are not available and if your hands are not visibly dirty. Avoid touching your eyes, nose, and mouth with unwashed hands.   Prevention Steps for Caregivers and Household Members of Individuals Confirmed to have, or Being Evaluated for, COVID-19 Infection Being Cared for in the Home  If you live with, or provide care at home for, a person confirmed to have, or being evaluated for, COVID-19 infection please follow these guidelines to prevent infection:  Follow healthcare providers instructions Make sure that you understand and can help the patient follow any healthcare provider instructions for all care.  Provide for the patients basic needs You should help the patient with basic needs in the home and provide support for getting groceries, prescriptions, and other personal needs.  Monitor the patients symptoms If they are getting sicker, call his or her medical provider and tell them that the patient has, or is being evaluated for, COVID-19 infection. This will help the healthcare  providers office take steps to keep other people from getting infected. Ask the healthcare provider to call the local or state health department.  Limit the number of people who have contact with the patient  If possible, have only one caregiver for the  patient.  Other household members should stay in another home or place of residence. If this is not possible, they should stay  in another room, or be separated from the patient as much as possible. Use a separate bathroom, if available.  Restrict visitors who do not have an essential need to be in the home.  Keep older adults, very young children, and other sick people away from the patient Keep older adults, very young children, and those who have compromised immune systems or chronic health conditions away from the patient. This includes people with chronic heart, lung, or kidney conditions, diabetes, and cancer.  Ensure good ventilation Make sure that shared spaces in the home have good air flow, such as from an air conditioner or an opened window, weather permitting.  Wash your hands often  Wash your hands often and thoroughly with soap and water for at least 20 seconds. You can use an alcohol based hand sanitizer if soap and water are not available and if your hands are not visibly dirty.  Avoid touching your eyes, nose, and mouth with unwashed hands.  Use disposable paper towels to dry your hands. If not available, use dedicated cloth towels and replace them when they become wet.  Wear a facemask and gloves  Wear a disposable facemask at all times in the room and gloves when you touch or have contact with the patients blood, body fluids, and/or secretions or excretions, such as sweat, saliva, sputum, nasal mucus, vomit, urine, or feces.  Ensure the mask fits over your nose and mouth tightly, and do not touch it during use.  Throw out disposable facemasks and gloves after using them. Do not reuse.  Wash your hands immediately after removing your facemask and gloves.  If your personal clothing becomes contaminated, carefully remove clothing and launder. Wash your hands after handling contaminated clothing.  Place all used disposable facemasks, gloves, and other waste in a lined  container before disposing them with other household waste.  Remove gloves and wash your hands immediately after handling these items.  Do not share dishes, glasses, or other household items with the patient  Avoid sharing household items. You should not share dishes, drinking glasses, cups, eating utensils, towels, bedding, or other items with a patient who is confirmed to have, or being evaluated for, COVID-19 infection.  After the person uses these items, you should wash them thoroughly with soap and water.  Wash laundry thoroughly  Immediately remove and wash clothes or bedding that have blood, body fluids, and/or secretions or excretions, such as sweat, saliva, sputum, nasal mucus, vomit, urine, or feces, on them.  Wear gloves when handling laundry from the patient.  Read and follow directions on labels of laundry or clothing items and detergent. In general, wash and dry with the warmest temperatures recommended on the label.  Clean all areas the individual has used often  Clean all touchable surfaces, such as counters, tabletops, doorknobs, bathroom fixtures, toilets, phones, keyboards, tablets, and bedside tables, every day. Also, clean any surfaces that may have blood, body fluids, and/or secretions or excretions on them.  Wear gloves when cleaning surfaces the patient has come in contact with.  Use a diluted bleach solution (e.g., dilute bleach with  1 part bleach and 10 parts water) or a household disinfectant with a label that says EPA-registered for coronaviruses. To make a bleach solution at home, add 1 tablespoon of bleach to 1 quart (4 cups) of water. For a larger supply, add  cup of bleach to 1 gallon (16 cups) of water.  Read labels of cleaning products and follow recommendations provided on product labels. Labels contain instructions for safe and effective use of the cleaning product including precautions you should take when applying the product, such as wearing gloves or  eye protection and making sure you have good ventilation during use of the product.  Remove gloves and wash hands immediately after cleaning.  Monitor yourself for signs and symptoms of illness Caregivers and household members are considered close contacts, should monitor their health, and will be asked to limit movement outside of the home to the extent possible. Follow the monitoring steps for close contacts listed on the symptom monitoring form.   ? If you have additional questions, contact your local health department or call the epidemiologist on call at [phone] (available 24/7). ? This guidance is subject to change. For the most up-to-date guidance from [facility], please refer to their website: [email or web address]

## 2020-11-25 ENCOUNTER — Inpatient Hospital Stay: Payer: BLUE CROSS/BLUE SHIELD

## 2021-05-18 IMAGING — DX DG CHEST 1V PORT SAME DAY
1 series · 1 of 1 positions shown · non-contrast
Comparison: November 06, 2020.

CLINICAL DATA: Shortness of breath.

EXAM:
PORTABLE CHEST 1 VIEW

[chest ap]
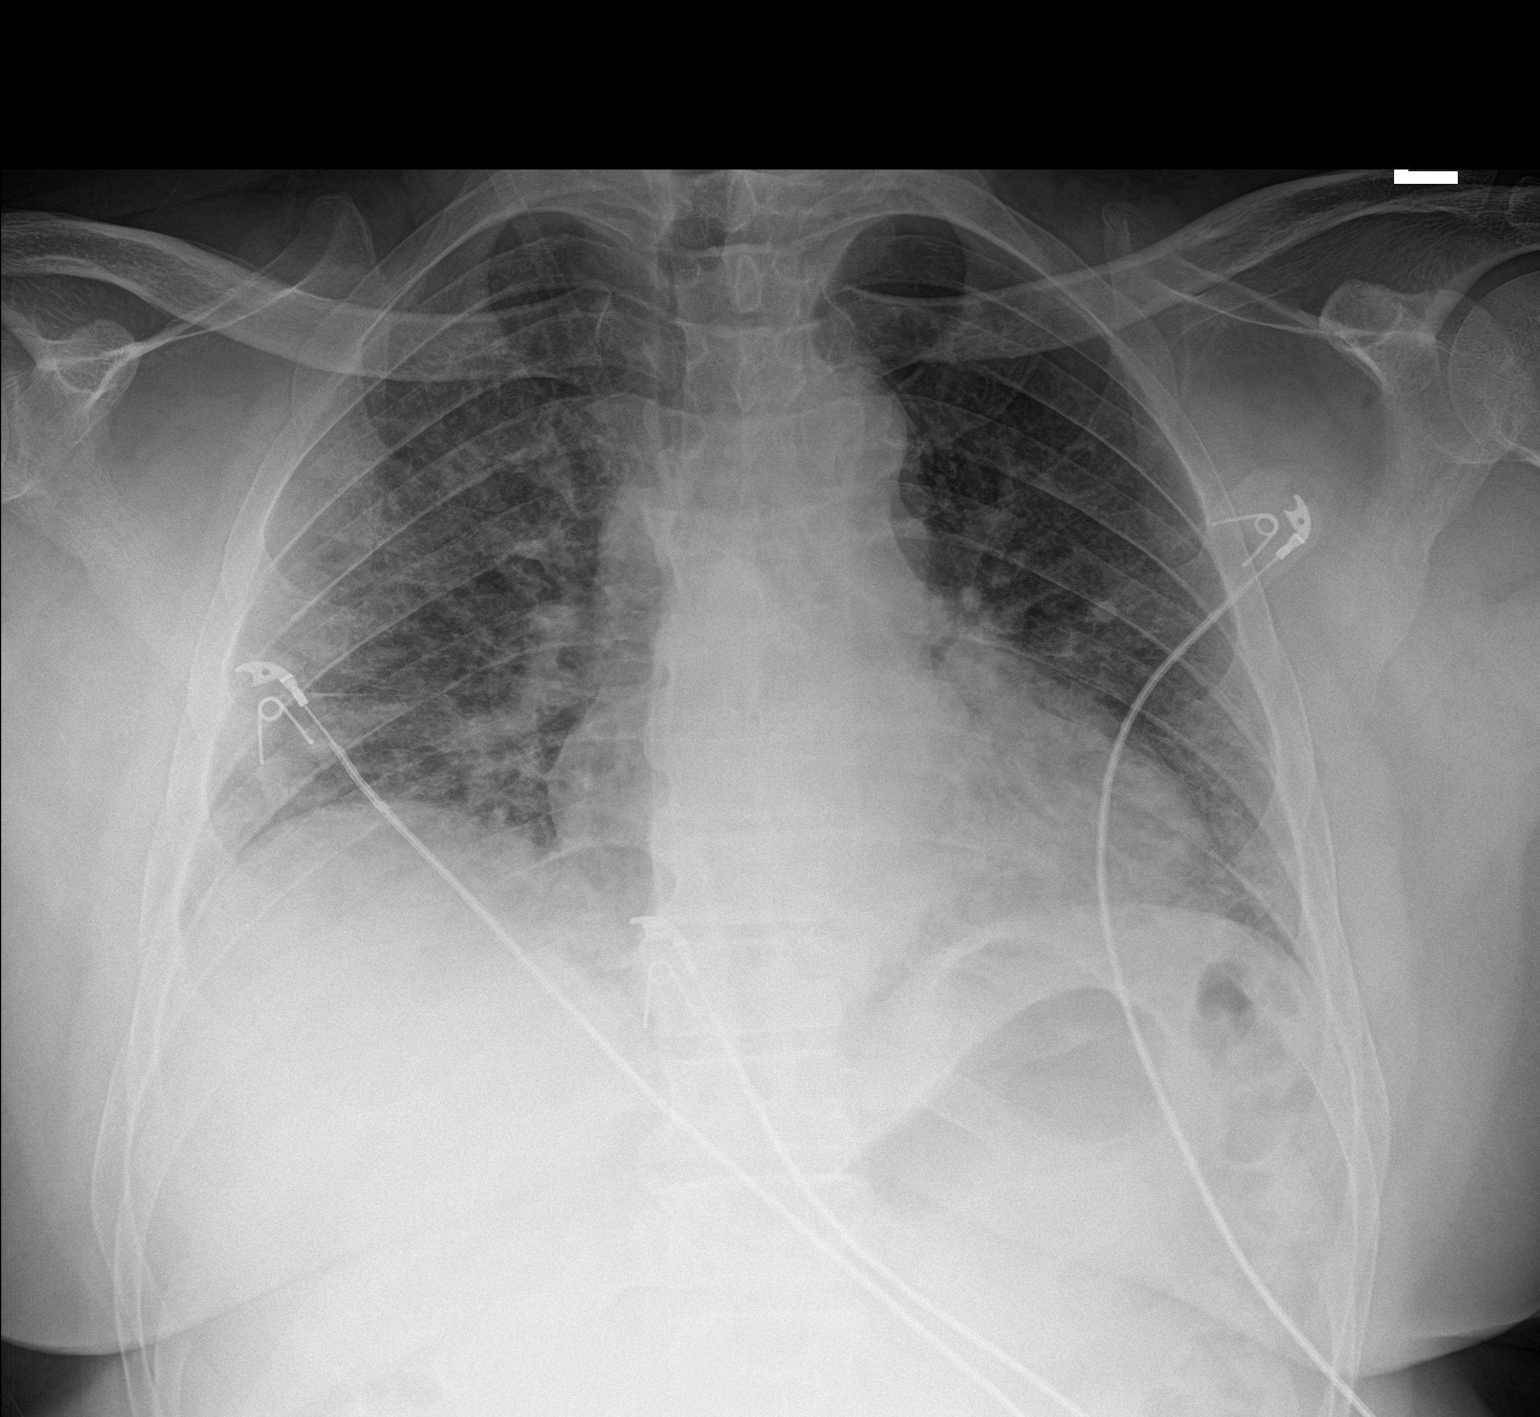

[1 of 1 positions shown; findings below may reference images not displayed]

FINDINGS: Stable cardiomediastinal silhouette. No pneumothorax or pleural
effusion is noted. Stable bilateral lung opacities are noted
consistent with multifocal pneumonia. Bony thorax is unremarkable.
IMPRESSION: Stable bilateral lung opacities consistent with multifocal
pneumonia.

## 2021-05-20 IMAGING — CT CT ANGIO CHEST
2 of 7 series · 19 of 46 positions shown · IV contrast (omnipaque)
Comparison: 11/05/2020

CLINICAL DATA: LZX2T-3J.  Shortness of breath

EXAM:
CT ANGIOGRAPHY CHEST WITH CONTRAST
TECHNIQUE: Multidetector CT imaging of the chest was performed using the
standard protocol during bolus administration of intravenous
contrast. Multiplanar CT image reconstructions and MIPs were
obtained to evaluate the vascular anatomy.
CONTRAST:  100mL OMNIPAQUE IOHEXOL 350 MG/ML SOLN

[Series 6: thins · axial · 0.83mm/px · z∈[+1295,+1516]mm · 16 of 249 slices shown]
[im 14/249  lung]
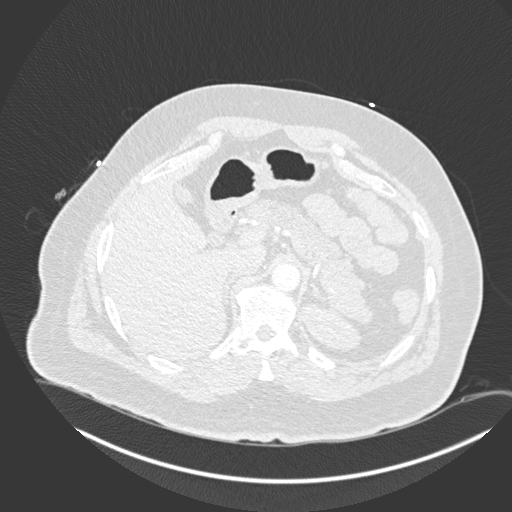
[im 27/249  soft-tissue]
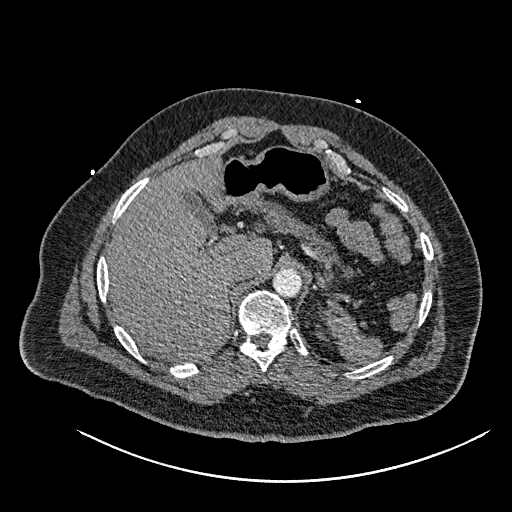
[im 40/249  lung]
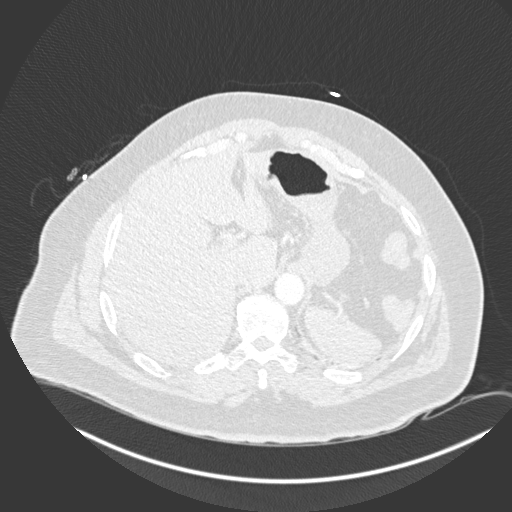
[im 53/249  soft-tissue]
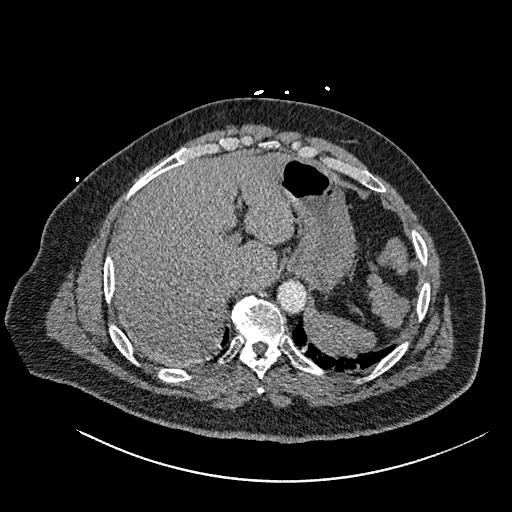
[im 79/249  lung]
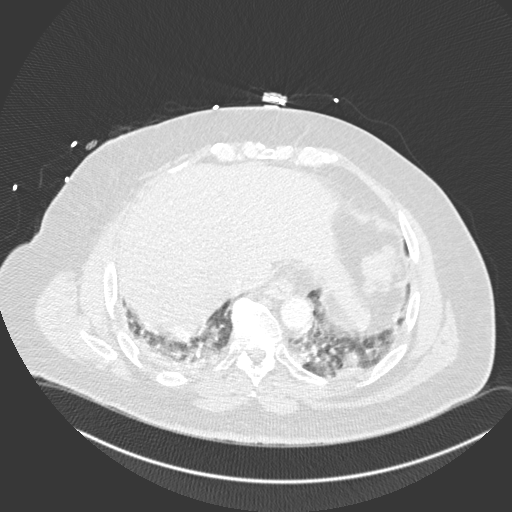
[im 92/249  soft-tissue]
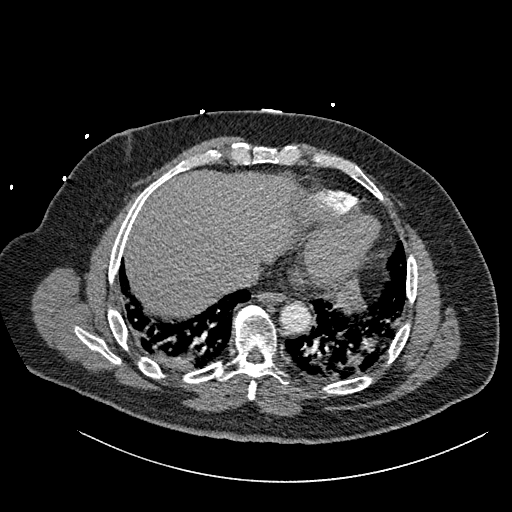
[im 105/249  lung]
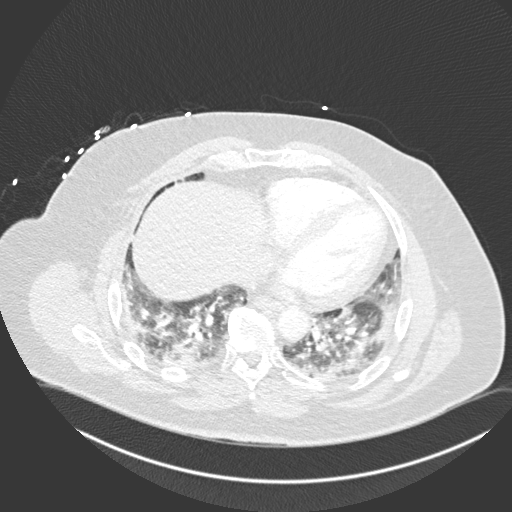
[im 118/249  soft-tissue]
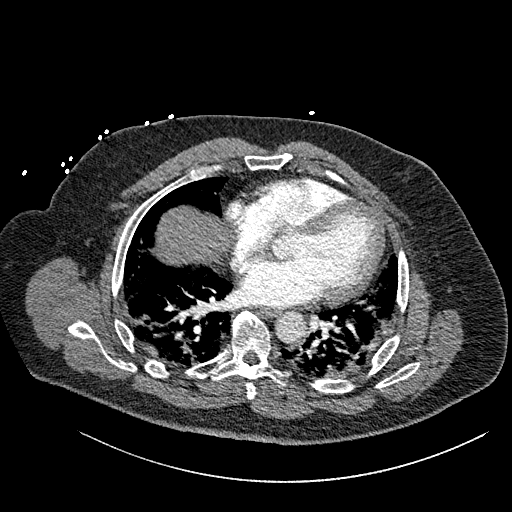
[im 131/249  lung]
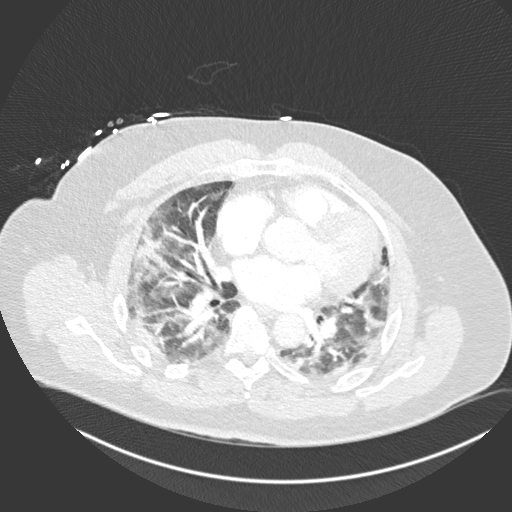
[im 144/249  soft-tissue]
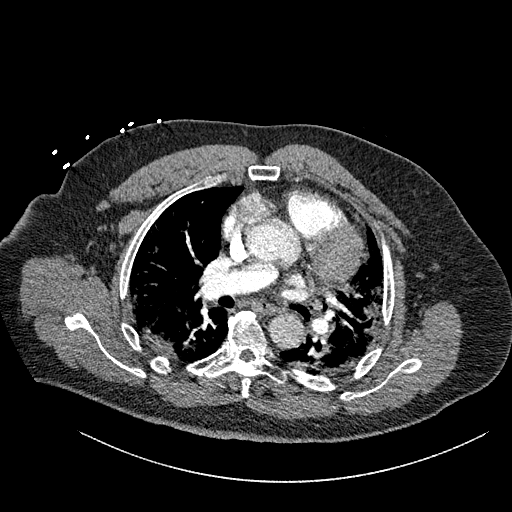
[im 157/249  lung]
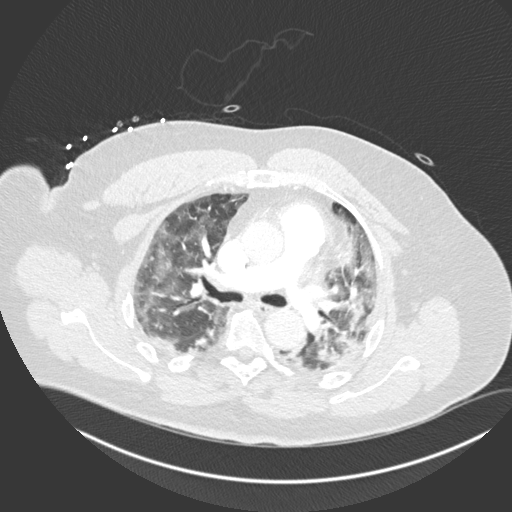
[im 170/249  soft-tissue]
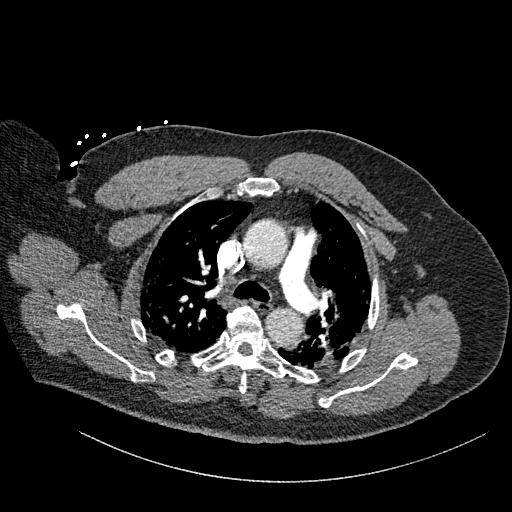
[im 196/249  lung]
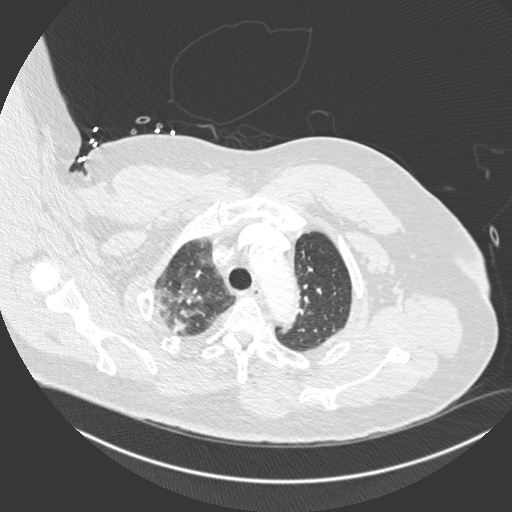
[im 209/249  soft-tissue]
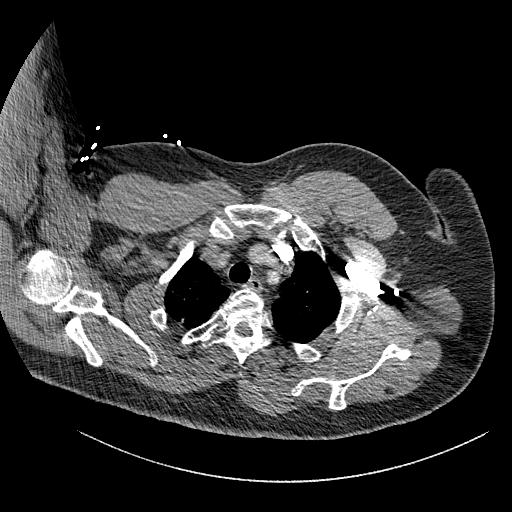
[im 222/249  lung]
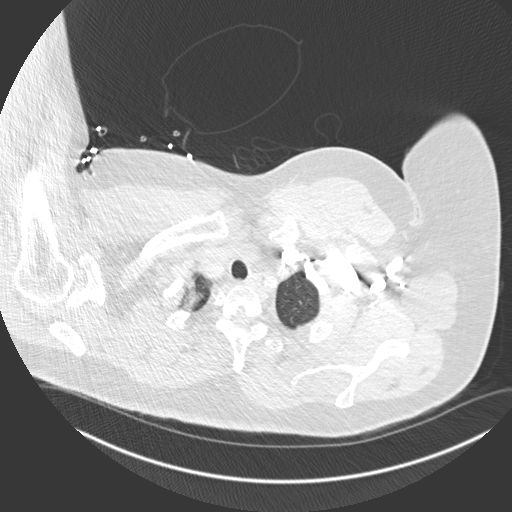
[im 235/249  soft-tissue]
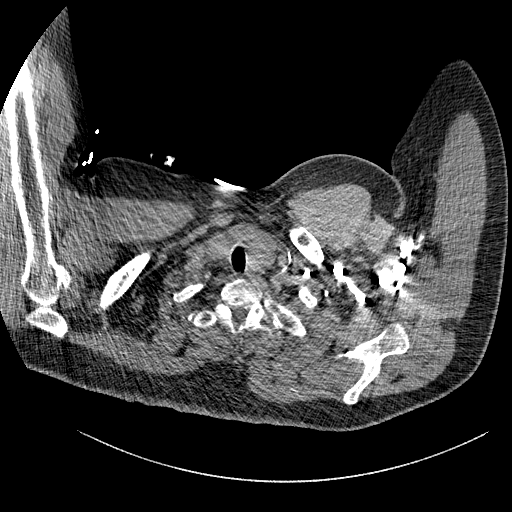

[Series 7: coronal mpr · coronal · 0.51mm/px · 3 of 151 slices shown]
[im 38/151  soft-tissue]
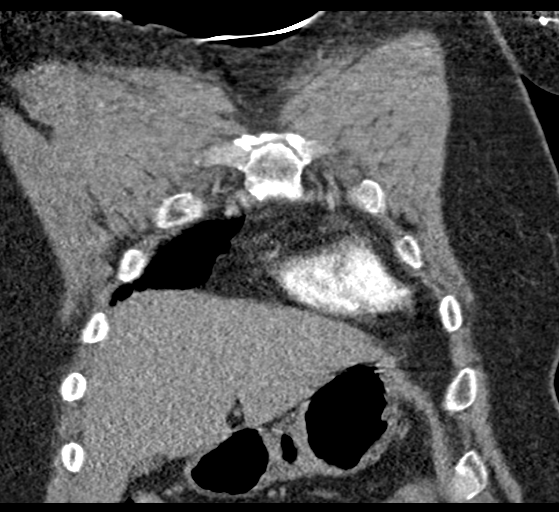
[im 76/151  soft-tissue]
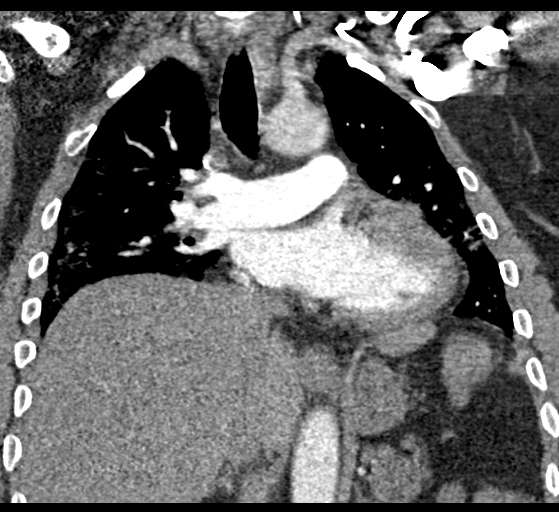
[im 113/151  soft-tissue]
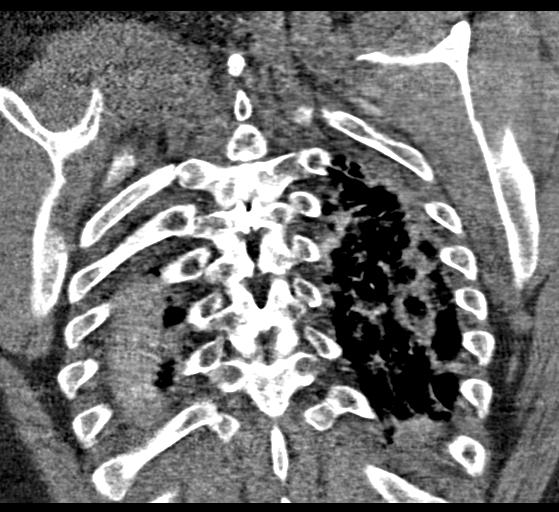

[19 of 46 positions shown; findings below may reference images not displayed]

FINDINGS: Cardiovascular: Contrast injection is sufficient to demonstrate
satisfactory opacification of the pulmonary arteries to the
segmental level. There is no pulmonary embolus or evidence of right
heart strain. The size of the main pulmonary artery is normal. Heart
size is normal, with no pericardial effusion. The course and caliber
of the aorta are normal. There is no atherosclerotic calcification.
Opacification decreased due to pulmonary arterial phase contrast
bolus timing.

Mediastinum/Nodes: No mediastinal, hilar or axillary
lymphadenopathy. 2 cm left thyroid nodule. Thoracic esophageal
course is normal.

Lungs/Pleura: Multifocal, peripheral predominant ground glass
opacities. No pleural effusion.

Upper Abdomen: Contrast bolus timing is not optimized for evaluation
of the abdominal organs. The visualized portions of the organs of
the upper abdomen are normal.

Musculoskeletal: No chest wall abnormality. No bony spinal canal
stenosis.

Review of the MIP images confirms the above findings.
IMPRESSION: 1. No pulmonary embolus or acute aortic syndrome.
2. Unchanged pattern of LZX2T-3J pneumonia.
3. 2 cm left thyroid nodule. Recommend thyroid US (ref: [HOSPITAL]. [DATE]): 143-50).

## 2022-09-10 ENCOUNTER — Emergency Department (HOSPITAL_COMMUNITY): Payer: BLUE CROSS/BLUE SHIELD

## 2022-09-10 ENCOUNTER — Inpatient Hospital Stay (HOSPITAL_COMMUNITY)
Admission: EM | Admit: 2022-09-10 | Discharge: 2022-09-13 | DRG: 251 | Disposition: A | Discharge status: Home / Self Care | Payer: BLUE CROSS/BLUE SHIELD | Attending: Interventional Cardiology | Admitting: Interventional Cardiology

## 2022-09-10 ENCOUNTER — Encounter (HOSPITAL_COMMUNITY): Payer: Self-pay

## 2022-09-10 DIAGNOSIS — R0609 Other forms of dyspnea: Secondary | ICD-10-CM | POA: Diagnosis present

## 2022-09-10 DIAGNOSIS — I10 Essential (primary) hypertension: Secondary | ICD-10-CM | POA: Diagnosis present

## 2022-09-10 DIAGNOSIS — E1169 Type 2 diabetes mellitus with other specified complication: Secondary | ICD-10-CM | POA: Diagnosis present

## 2022-09-10 DIAGNOSIS — U099 Post covid-19 condition, unspecified: Secondary | ICD-10-CM | POA: Diagnosis present

## 2022-09-10 DIAGNOSIS — J9611 Chronic respiratory failure with hypoxia: Secondary | ICD-10-CM | POA: Diagnosis present

## 2022-09-10 DIAGNOSIS — I4510 Unspecified right bundle-branch block: Secondary | ICD-10-CM | POA: Diagnosis present

## 2022-09-10 DIAGNOSIS — E785 Hyperlipidemia, unspecified: Secondary | ICD-10-CM | POA: Diagnosis present

## 2022-09-10 DIAGNOSIS — I214 Non-ST elevation (NSTEMI) myocardial infarction: Principal | ICD-10-CM | POA: Diagnosis present

## 2022-09-10 DIAGNOSIS — I25110 Atherosclerotic heart disease of native coronary artery with unstable angina pectoris: Secondary | ICD-10-CM | POA: Diagnosis present

## 2022-09-10 DIAGNOSIS — E099 Drug or chemical induced diabetes mellitus without complications: Secondary | ICD-10-CM | POA: Diagnosis present

## 2022-09-10 DIAGNOSIS — T380X5A Adverse effect of glucocorticoids and synthetic analogues, initial encounter: Secondary | ICD-10-CM | POA: Diagnosis present

## 2022-09-10 DIAGNOSIS — Z23 Encounter for immunization: Secondary | ICD-10-CM

## 2022-09-10 DIAGNOSIS — I4729 Other ventricular tachycardia: Secondary | ICD-10-CM | POA: Diagnosis present

## 2022-09-10 DIAGNOSIS — E119 Type 2 diabetes mellitus without complications: Secondary | ICD-10-CM

## 2022-09-10 DIAGNOSIS — I472 Ventricular tachycardia, unspecified: Secondary | ICD-10-CM | POA: Diagnosis present

## 2022-09-10 LAB — BASIC METABOLIC PANEL
Anion gap: 9 (ref 5–15)
BUN: 11 mg/dL (ref 8–23)
CO2: 26 mmol/L (ref 22–32)
Calcium: 9.3 mg/dL (ref 8.9–10.3)
Chloride: 103 mmol/L (ref 98–111)
Creatinine, Ser: 1.16 mg/dL (ref 0.61–1.24)
GFR, Estimated: >60 mL/min (ref >60)
Glucose, Bld: 182 mg/dL — ABNORMAL HIGH (ref 70–99)
Potassium: 3.9 mmol/L (ref 3.5–5.1)
Sodium: 138 mmol/L (ref 135–145)

## 2022-09-10 LAB — TROPONIN I (HIGH SENSITIVITY): Troponin I (High Sensitivity): 31 ng/L — ABNORMAL HIGH (ref <18)

## 2022-09-10 LAB — CBC
HCT: 48 % (ref 39.0–52.0)
Hemoglobin: 15.4 g/dL (ref 13.0–17.0)
MCH: 28.8 pg (ref 26.0–34.0)
MCHC: 32.1 g/dL (ref 30.0–36.0)
MCV: 89.7 fL (ref 80.0–100.0)
Platelets: 260 10*3/uL (ref 150–400)
RBC: 5.35 MIL/uL (ref 4.22–5.81)
RDW: 13.9 % (ref 11.5–15.5)
WBC: 9.6 10*3/uL (ref 4.0–10.5)
nRBC: 0 % (ref 0.0–0.2)

## 2022-09-10 MED ORDER — ASPIRIN 81 MG PO CHEW
324.0000 mg | CHEWABLE_TABLET | Freq: Once | ORAL | Status: AC
  Administered 2022-09-10: 324 mg via ORAL
  Filled 2022-09-10: qty 4

## 2022-09-10 MED ORDER — NITROGLYCERIN 0.4 MG SL SUBL
0.4000 mg | SUBLINGUAL_TABLET | SUBLINGUAL | Status: DC | PRN
  Administered 2022-09-10 – 2022-09-11 (×3): 0.4 mg via SUBLINGUAL
  Filled 2022-09-10: qty 1

## 2022-09-10 NOTE — ED Provider Triage Note (Signed)
Emergency Medicine Provider Triage Evaluation Note  Benjamin Munoz , a 61 y.o. male  was evaluated in triage.  Pt complains of chest pain.  Reports it started around an hour ago while he was driving.  Left-sided and feels tight.  Says it somewhat goes down his left arm.  No history of ACS.  Denies any medical history however past medical history and chart lists hypertension and diabetes.  He took 4 Aleve when this started which did not relieve his symptoms.  Review of Systems  Positive: Chest pain Negative: Potation's or shortness of breath  Physical Exam  BP 138/68   Pulse 73   Temp 98 F (36.7 C) (Oral)   Resp 20   Ht 5\' 5"  (1.651 m)   SpO2 98%   BMI 34.96 kg/m  Gen:   Awake, no distress   Resp:  Normal effort  MSK:   Moves extremities without difficulty  Other:   Medical Decision Making  Medically screening exam initiated at 8:13 PM.  Appropriate orders placed.  Deborra Medina was informed that the remainder of the evaluation will be completed by another provider, this initial triage assessment does not replace that evaluation, and the importance of remaining in the ED until their evaluation is complete.     Rhae Hammock, PA-C 09/10/22 2114

## 2022-09-10 NOTE — ED Triage Notes (Signed)
Pt reports CP and SOB that started about an hour ago while driving, denies n/v/dizziness

## 2022-09-10 NOTE — ED Provider Notes (Signed)
[facility] DEPT Provider Note   CSN: [id number] Arrival date & time: 09/10/22  2000     History {Add pertinent medical, surgical, social history, OB history to HPI:1} Chief Complaint  Patient presents with   Chest Pain    Benjamin Munoz is a 61 y.o. male.  The history is provided by the patient and medical records.  Chest Pain  61 year old male with previous history of diabetes but no longer on medication, presenting to the ED with chest pain.  This began around 5:30 PM while he was driving home from work.  Pain left-sided, described as pressure and some tightness.  He does report radiation down the left arm and into his left ear.  When pain immediately onset he did experience shortness of breath, diaphoresis, and vomited x1.  He went home and took 4 Aleve and laid down for a bit with some improvement  but pain now recurring.  He denies any known cardiac history.  No family cardiac history.  He is not a smoker.  He has never had any prior cardiac work-up in the past.  Home Medications Prior to Admission medications   Medication Sig Start Date End Date Taking? Authorizing Provider  acetaminophen (TYLENOL) 325 MG tablet Take 650 mg by mouth every 6 (six) hours as needed for mild pain, fever or headache.    [provider]  albuterol (VENTOLIN HFA) 108 (90 Base) MCG/ACT inhaler Inhale 2 puffs into the lungs every 6 (six) hours as needed for wheezing or shortness of breath. 11/13/20   Ghimire, Henreitta Leber, MD  blood glucose meter kit and supplies Dispense based on patient and insurance preference. Use up to four times daily as directed. (FOR ICD-10 E10.9, E11.9). 11/13/20   Ghimire, Henreitta Leber, MD  metFORMIN (GLUCOPHAGE) 500 MG tablet Take 1 tablet (500 mg total) by mouth 2 (two) times daily with a meal. 11/13/20 11/13/21  Ghimire, Henreitta Leber, MD  predniSONE (DELTASONE) 10 MG tablet Take 40 mg daily for 1 day, 30 mg daily for 1 day, 20 mg daily for 1  days,10 mg daily for 1 day, then stop 11/13/20   Jonetta Osgood, MD      Allergies    Patient has no known allergies.    Review of Systems   Review of Systems  Cardiovascular:  Positive for chest pain.  All other systems reviewed and are negative.   Physical Exam Updated Vital Signs BP 130/80   Pulse 77   Temp 98 F (36.7 C) (Oral)   Resp 20   Ht 5' 5" (1.651 m)   SpO2 100%   BMI 34.96 kg/m   Physical Exam Vitals and nursing note reviewed.  Constitutional:      Appearance: He is well-developed.  HENT:     Head: Normocephalic and atraumatic.  Eyes:     Conjunctiva/sclera: Conjunctivae normal.     Pupils: Pupils are equal, round, and reactive to light.  Cardiovascular:     Rate and Rhythm: Normal rate and regular rhythm.     Heart sounds: Normal heart sounds.  Pulmonary:     Effort: Pulmonary effort is normal.     Breath sounds: Normal breath sounds. No wheezing or rhonchi.  Abdominal:     General: Bowel sounds are normal.     Palpations: Abdomen is soft.  Musculoskeletal:        General: Normal range of motion.     Cervical back: Normal range of motion.  Skin:  General: Skin is warm and dry.  Neurological:     Mental Status: He is alert and oriented to person, place, and time.     ED Results / Procedures / Treatments   Labs (all labs ordered are listed, but only abnormal results are displayed) Labs Reviewed  BASIC METABOLIC PANEL - Abnormal; Notable for the following components:      Result Value   Glucose, Bld 182 (*)    All other components within normal limits  TROPONIN I (HIGH SENSITIVITY) - Abnormal; Notable for the following components:   Troponin I (High Sensitivity) 31 (*)    All other components within normal limits  CBC  TROPONIN I (HIGH SENSITIVITY)    EKG None  Radiology DG Chest 2 View  Result Date: 09/10/2022 CLINICAL DATA:  Chest pain EXAM: CHEST - 2 VIEW COMPARISON:  CTA chest dated 11/09/2020 FINDINGS: Lungs are clear.   No pleural effusion or pneumothorax. The heart is normal in size. Degenerative changes of the visualized thoracolumbar spine. IMPRESSION: Normal chest radiographs. Electronically Signed   By: Julian Hy M.D.   On: 09/10/2022 20:37    Procedures Procedures  {Document cardiac monitor, telemetry assessment procedure when appropriate:1}  Medications Ordered in ED Medications  aspirin chewable tablet 324 mg (has no administration in time range)  nitroGLYCERIN (NITROSTAT) SL tablet 0.4 mg (has no administration in time range)    ED Course/ Medical Decision Making/ A&P                           Medical Decision Making Risk OTC drugs. Prescription drug management.   ***  11:13 PM Spoke with cardiology, Dr. Ailene Ravel-- as 2nd trop running now, will touch base once this results.  If flat, may can be admitted to [facility] if uptrending likely will need to go to [facility] Final Clinical Impression(s) / ED Diagnoses Final diagnoses:  None    Rx / DC Orders ED Discharge Orders     None

## 2022-09-11 ENCOUNTER — Encounter (HOSPITAL_COMMUNITY): Payer: Self-pay | Admitting: Student in an Organized Health Care Education/Training Program

## 2022-09-11 ENCOUNTER — Inpatient Hospital Stay (HOSPITAL_COMMUNITY): Payer: BLUE CROSS/BLUE SHIELD

## 2022-09-11 ENCOUNTER — Encounter (HOSPITAL_COMMUNITY)
Admission: EM | Disposition: A | Discharge status: Home / Self Care | Payer: Self-pay | Source: Home / Self Care | Attending: Interventional Cardiology

## 2022-09-11 DIAGNOSIS — J9611 Chronic respiratory failure with hypoxia: Secondary | ICD-10-CM | POA: Diagnosis not present

## 2022-09-11 DIAGNOSIS — I25110 Atherosclerotic heart disease of native coronary artery with unstable angina pectoris: Secondary | ICD-10-CM | POA: Diagnosis not present

## 2022-09-11 DIAGNOSIS — E785 Hyperlipidemia, unspecified: Secondary | ICD-10-CM | POA: Diagnosis not present

## 2022-09-11 DIAGNOSIS — I4729 Other ventricular tachycardia: Secondary | ICD-10-CM | POA: Diagnosis present

## 2022-09-11 DIAGNOSIS — E119 Type 2 diabetes mellitus without complications: Secondary | ICD-10-CM

## 2022-09-11 DIAGNOSIS — T380X5A Adverse effect of glucocorticoids and synthetic analogues, initial encounter: Secondary | ICD-10-CM | POA: Diagnosis not present

## 2022-09-11 DIAGNOSIS — I214 Non-ST elevation (NSTEMI) myocardial infarction: Secondary | ICD-10-CM

## 2022-09-11 DIAGNOSIS — I2510 Atherosclerotic heart disease of native coronary artery without angina pectoris: Secondary | ICD-10-CM | POA: Diagnosis not present

## 2022-09-11 DIAGNOSIS — I472 Ventricular tachycardia, unspecified: Secondary | ICD-10-CM | POA: Diagnosis not present

## 2022-09-11 DIAGNOSIS — E1169 Type 2 diabetes mellitus with other specified complication: Secondary | ICD-10-CM

## 2022-09-11 DIAGNOSIS — E1159 Type 2 diabetes mellitus with other circulatory complications: Secondary | ICD-10-CM | POA: Diagnosis not present

## 2022-09-11 DIAGNOSIS — U099 Post covid-19 condition, unspecified: Secondary | ICD-10-CM | POA: Diagnosis not present

## 2022-09-11 DIAGNOSIS — E099 Drug or chemical induced diabetes mellitus without complications: Secondary | ICD-10-CM | POA: Diagnosis not present

## 2022-09-11 DIAGNOSIS — Z23 Encounter for immunization: Secondary | ICD-10-CM | POA: Diagnosis not present

## 2022-09-11 DIAGNOSIS — I4510 Unspecified right bundle-branch block: Secondary | ICD-10-CM | POA: Diagnosis not present

## 2022-09-11 DIAGNOSIS — I10 Essential (primary) hypertension: Secondary | ICD-10-CM | POA: Diagnosis not present

## 2022-09-11 HISTORY — DX: Type 2 diabetes mellitus without complications: E11.9

## 2022-09-11 HISTORY — DX: Atherosclerotic heart disease of native coronary artery with unstable angina pectoris: I25.110

## 2022-09-11 HISTORY — DX: Non-ST elevation (NSTEMI) myocardial infarction: I21.4

## 2022-09-11 HISTORY — PX: LEFT HEART CATH AND CORONARY ANGIOGRAPHY: CATH118249

## 2022-09-11 HISTORY — DX: Hyperlipidemia, unspecified: E11.69

## 2022-09-11 HISTORY — PX: CORONARY STENT INTERVENTION: CATH118234

## 2022-09-11 LAB — HEMOGLOBIN A1C
Hgb A1c MFr Bld: 6.5 % — ABNORMAL HIGH (ref 4.8–5.6)
Mean Plasma Glucose: 139.85 mg/dL

## 2022-09-11 LAB — LIPID PANEL
Cholesterol: 198 mg/dL (ref 0–200)
HDL: 40 mg/dL — ABNORMAL LOW (ref >40)
LDL Cholesterol: 152 mg/dL — ABNORMAL HIGH (ref 0–99)
Total CHOL/HDL Ratio: 5 RATIO
Triglycerides: 31 mg/dL (ref <150)
VLDL: 6 mg/dL (ref 0–40)

## 2022-09-11 LAB — ECHOCARDIOGRAM COMPLETE
AR max vel: 2.32 cm2
AV Area VTI: 2.3 cm2
AV Area mean vel: 2.26 cm2
AV Mean grad: 3 mmHg
AV Peak grad: 5.1 mmHg
Ao pk vel: 1.13 m/s
Area-P 1/2: 2.46 cm2
Height: 69 in
S' Lateral: 3.4 cm
Weight: 3470.92 oz

## 2022-09-11 LAB — BRAIN NATRIURETIC PEPTIDE: B Natriuretic Peptide: 15.9 pg/mL (ref 0.0–100.0)

## 2022-09-11 LAB — TSH: TSH: 0.476 u[IU]/mL (ref 0.350–4.500)

## 2022-09-11 LAB — CBC
HCT: 44.1 % (ref 39.0–52.0)
Hemoglobin: 14.7 g/dL (ref 13.0–17.0)
MCH: 29.3 pg (ref 26.0–34.0)
MCHC: 33.3 g/dL (ref 30.0–36.0)
MCV: 88 fL (ref 80.0–100.0)
Platelets: 241 10*3/uL (ref 150–400)
RBC: 5.01 MIL/uL (ref 4.22–5.81)
RDW: 13.8 % (ref 11.5–15.5)
WBC: 12.4 10*3/uL — ABNORMAL HIGH (ref 4.0–10.5)
nRBC: 0 % (ref 0.0–0.2)

## 2022-09-11 LAB — HEPARIN LEVEL (UNFRACTIONATED): Heparin Unfractionated: 0.32 IU/mL (ref 0.30–0.70)

## 2022-09-11 LAB — TROPONIN I (HIGH SENSITIVITY)
Troponin I (High Sensitivity): 379 ng/L (ref <18)
Troponin I (High Sensitivity): 1037 ng/L (ref <18)
Troponin I (High Sensitivity): 1891 ng/L (ref <18)
Troponin I (High Sensitivity): 3326 ng/L (ref <18)
Troponin I (High Sensitivity): 3952 ng/L (ref <18)

## 2022-09-11 LAB — HIV ANTIBODY (ROUTINE TESTING W REFLEX): HIV Screen 4th Generation wRfx: NONREACTIVE

## 2022-09-11 LAB — GLUCOSE, CAPILLARY: Glucose-Capillary: 110 mg/dL — ABNORMAL HIGH (ref 70–99)

## 2022-09-11 LAB — POCT ACTIVATED CLOTTING TIME: Activated Clotting Time: 329 seconds

## 2022-09-11 LAB — MRSA NEXT GEN BY PCR, NASAL: MRSA by PCR Next Gen: NOT DETECTED

## 2022-09-11 SURGERY — LEFT HEART CATH AND CORONARY ANGIOGRAPHY
Anesthesia: LOCAL

## 2022-09-11 MED ORDER — SODIUM CHLORIDE 0.9% FLUSH
3.0000 mL | Freq: Two times a day (BID) | INTRAVENOUS | Status: DC
  Administered 2022-09-12 – 2022-09-13 (×3): 3 mL via INTRAVENOUS

## 2022-09-11 MED ORDER — LIDOCAINE HCL (PF) 1 % IJ SOLN
INTRAMUSCULAR | Status: AC
  Filled 2022-09-11: qty 30

## 2022-09-11 MED ORDER — ONDANSETRON HCL 4 MG/2ML IJ SOLN: 4.0000 mg | Freq: Four times a day (QID) | INTRAMUSCULAR | Status: DC | PRN

## 2022-09-11 MED ORDER — ASPIRIN 81 MG PO CHEW
81.0000 mg | CHEWABLE_TABLET | Freq: Every day | ORAL | Status: DC
  Administered 2022-09-12 – 2022-09-13 (×2): 81 mg via ORAL
  Filled 2022-09-11 (×2): qty 1

## 2022-09-11 MED ORDER — HEPARIN (PORCINE) IN NACL 1000-0.9 UT/500ML-% IV SOLN
INTRAVENOUS | Status: AC
  Filled 2022-09-11: qty 500

## 2022-09-11 MED ORDER — ONDANSETRON HCL 4 MG/2ML IJ SOLN
4.0000 mg | Freq: Four times a day (QID) | INTRAMUSCULAR | Status: DC | PRN
  Administered 2022-09-11: 4 mg via INTRAVENOUS
  Filled 2022-09-11 (×2): qty 2

## 2022-09-11 MED ORDER — LIDOCAINE HCL (PF) 1 % IJ SOLN
INTRAMUSCULAR | Status: DC | PRN
  Administered 2022-09-11: 2 mL via INTRADERMAL

## 2022-09-11 MED ORDER — HEPARIN (PORCINE) 25000 UT/250ML-% IV SOLN
1300.0000 [IU]/h | INTRAVENOUS | Status: AC
  Administered 2022-09-11: 1000 [IU]/h via INTRAVENOUS
  Administered 2022-09-12: 1300 [IU]/h via INTRAVENOUS
  Filled 2022-09-11 (×2): qty 250

## 2022-09-11 MED ORDER — MIDAZOLAM HCL 2 MG/2ML IJ SOLN
INTRAMUSCULAR | Status: AC
  Filled 2022-09-11: qty 2

## 2022-09-11 MED ORDER — METOPROLOL TARTRATE 12.5 MG HALF TABLET: 12.5000 mg | ORAL_TABLET | Freq: Two times a day (BID) | ORAL | Status: DC

## 2022-09-11 MED ORDER — ACETAMINOPHEN 325 MG PO TABS: 650.0000 mg | ORAL_TABLET | ORAL | Status: DC | PRN

## 2022-09-11 MED ORDER — ASPIRIN 81 MG PO TBEC: 81.0000 mg | DELAYED_RELEASE_TABLET | Freq: Every day | ORAL | Status: DC

## 2022-09-11 MED ORDER — CLOPIDOGREL BISULFATE 300 MG PO TABS
ORAL_TABLET | ORAL | Status: AC
  Filled 2022-09-11: qty 2

## 2022-09-11 MED ORDER — ASPIRIN 81 MG PO CHEW: 81.0000 mg | CHEWABLE_TABLET | ORAL | Status: DC

## 2022-09-11 MED ORDER — VERAPAMIL HCL 2.5 MG/ML IV SOLN
INTRAVENOUS | Status: DC | PRN
  Administered 2022-09-11: 10 mL via INTRA_ARTERIAL

## 2022-09-11 MED ORDER — NITROGLYCERIN 1 MG/10 ML FOR IR/CATH LAB
INTRA_ARTERIAL | Status: DC | PRN
  Administered 2022-09-11: 200 ug via INTRACORONARY

## 2022-09-11 MED ORDER — ATORVASTATIN CALCIUM 80 MG PO TABS
80.0000 mg | ORAL_TABLET | Freq: Every day | ORAL | Status: DC
  Administered 2022-09-12 – 2022-09-13 (×2): 80 mg via ORAL
  Filled 2022-09-11 (×2): qty 1

## 2022-09-11 MED ORDER — SODIUM CHLORIDE 0.9 % IV SOLN: 250.0000 mL | INTRAVENOUS | Status: DC | PRN

## 2022-09-11 MED ORDER — SODIUM CHLORIDE 0.9% FLUSH: 3.0000 mL | INTRAVENOUS | Status: DC | PRN

## 2022-09-11 MED ORDER — NITROGLYCERIN 1 MG/10 ML FOR IR/CATH LAB
INTRA_ARTERIAL | Status: AC
  Filled 2022-09-11: qty 10

## 2022-09-11 MED ORDER — MIDAZOLAM HCL 2 MG/2ML IJ SOLN
INTRAMUSCULAR | Status: DC | PRN
  Administered 2022-09-11: 1 mg via INTRAVENOUS

## 2022-09-11 MED ORDER — HEPARIN SODIUM (PORCINE) 1000 UNIT/ML IJ SOLN
INTRAMUSCULAR | Status: DC | PRN
  Administered 2022-09-11: 5000 [IU] via INTRAVENOUS
  Administered 2022-09-11: 4000 [IU] via INTRAVENOUS

## 2022-09-11 MED ORDER — ORAL CARE MOUTH RINSE: 15.0000 mL | OROMUCOSAL | Status: DC | PRN

## 2022-09-11 MED ORDER — SODIUM CHLORIDE 0.9 % WEIGHT BASED INFUSION: 3.0000 mL/kg/h | INTRAVENOUS | Status: DC

## 2022-09-11 MED ORDER — NITROGLYCERIN 0.4 MG SL SUBL
0.4000 mg | SUBLINGUAL_TABLET | SUBLINGUAL | Status: DC | PRN
  Administered 2022-09-11: 0.4 mg via SUBLINGUAL
  Filled 2022-09-11: qty 1

## 2022-09-11 MED ORDER — HEPARIN SODIUM (PORCINE) 5000 UNIT/ML IJ SOLN
INTRAMUSCULAR | Status: AC
  Filled 2022-09-11: qty 1

## 2022-09-11 MED ORDER — CHLORHEXIDINE GLUCONATE CLOTH 2 % EX PADS
6.0000 | MEDICATED_PAD | Freq: Every day | CUTANEOUS | Status: DC
  Administered 2022-09-11 – 2022-09-13 (×3): 6 via TOPICAL

## 2022-09-11 MED ORDER — HEPARIN (PORCINE) 25000 UT/250ML-% IV SOLN
1100.0000 [IU]/h | INTRAVENOUS | Status: DC
  Administered 2022-09-11: 1000 [IU]/h via INTRAVENOUS
  Filled 2022-09-11: qty 250

## 2022-09-11 MED ORDER — ASPIRIN 81 MG PO CHEW
CHEWABLE_TABLET | ORAL | Status: AC
  Filled 2022-09-11: qty 4

## 2022-09-11 MED ORDER — FENTANYL CITRATE (PF) 100 MCG/2ML IJ SOLN
INTRAMUSCULAR | Status: DC | PRN
  Administered 2022-09-11: 25 ug via INTRAVENOUS

## 2022-09-11 MED ORDER — CLOPIDOGREL BISULFATE 75 MG PO TABS
75.0000 mg | ORAL_TABLET | Freq: Every day | ORAL | Status: DC
  Administered 2022-09-12 – 2022-09-13 (×2): 75 mg via ORAL
  Filled 2022-09-11 (×2): qty 1

## 2022-09-11 MED ORDER — HEPARIN (PORCINE) IN NACL 1000-0.9 UT/500ML-% IV SOLN
INTRAVENOUS | Status: DC | PRN
  Administered 2022-09-11 (×4): 500 mL

## 2022-09-11 MED ORDER — HYDRALAZINE HCL 20 MG/ML IJ SOLN: 10.0000 mg | INTRAMUSCULAR | Status: AC | PRN

## 2022-09-11 MED ORDER — SODIUM CHLORIDE 0.9% FLUSH
3.0000 mL | Freq: Two times a day (BID) | INTRAVENOUS | Status: DC
  Administered 2022-09-11 – 2022-09-13 (×4): 3 mL via INTRAVENOUS

## 2022-09-11 MED ORDER — SODIUM CHLORIDE 0.9 % IV SOLN: INTRAVENOUS | Status: AC

## 2022-09-11 MED ORDER — LABETALOL HCL 5 MG/ML IV SOLN: 10.0000 mg | INTRAVENOUS | Status: AC | PRN

## 2022-09-11 MED ORDER — FENTANYL CITRATE (PF) 100 MCG/2ML IJ SOLN
INTRAMUSCULAR | Status: AC
  Filled 2022-09-11: qty 2

## 2022-09-11 MED ORDER — COVID-19 MRNA 2023-2024 VACCINE (COMIRNATY) 0.3 ML INJECTION
0.3000 mL | Freq: Once | INTRAMUSCULAR | Status: DC
  Filled 2022-09-11: qty 0.3

## 2022-09-11 MED ORDER — SODIUM CHLORIDE 0.9 % WEIGHT BASED INFUSION: 1.0000 mL/kg/h | INTRAVENOUS | Status: DC

## 2022-09-11 MED ORDER — ASPIRIN 81 MG PO CHEW
324.0000 mg | CHEWABLE_TABLET | Freq: Once | ORAL | Status: AC
  Administered 2022-09-11: 324 mg via ORAL

## 2022-09-11 MED ORDER — SODIUM CHLORIDE 0.9 % IV SOLN
INTRAVENOUS | Status: AC | PRN
  Administered 2022-09-11: 10 mL/h via INTRAVENOUS

## 2022-09-11 MED ORDER — ATORVASTATIN CALCIUM 80 MG PO TABS
80.0000 mg | ORAL_TABLET | Freq: Every day | ORAL | Status: DC
  Administered 2022-09-11: 80 mg via ORAL
  Filled 2022-09-11: qty 2

## 2022-09-11 MED ORDER — HEPARIN BOLUS VIA INFUSION
4000.0000 [IU] | Freq: Once | INTRAVENOUS | Status: AC
  Administered 2022-09-11: 4000 [IU] via INTRAVENOUS
  Filled 2022-09-11: qty 4000

## 2022-09-11 MED ORDER — INFLUENZA VAC SPLIT QUAD 0.5 ML IM SUSY: 0.5000 mL | PREFILLED_SYRINGE | INTRAMUSCULAR | Status: DC

## 2022-09-11 MED ORDER — VERAPAMIL HCL 2.5 MG/ML IV SOLN
INTRAVENOUS | Status: AC
  Filled 2022-09-11: qty 2

## 2022-09-11 MED ORDER — METOPROLOL TARTRATE 12.5 MG HALF TABLET
12.5000 mg | ORAL_TABLET | Freq: Two times a day (BID) | ORAL | Status: DC
  Administered 2022-09-11 – 2022-09-13 (×4): 12.5 mg via ORAL
  Filled 2022-09-11 (×4): qty 1

## 2022-09-11 MED ORDER — PERFLUTREN LIPID MICROSPHERE
1.0000 mL | INTRAVENOUS | Status: AC | PRN
  Administered 2022-09-11: 2 mL via INTRAVENOUS

## 2022-09-11 MED ORDER — CLOPIDOGREL BISULFATE 300 MG PO TABS
ORAL_TABLET | ORAL | Status: DC | PRN
  Administered 2022-09-11: 600 mg via ORAL

## 2022-09-11 MED ORDER — HEPARIN SODIUM (PORCINE) 1000 UNIT/ML IJ SOLN
INTRAMUSCULAR | Status: AC
  Filled 2022-09-11: qty 10

## 2022-09-11 SURGICAL SUPPLY — 16 items
CATH 5FR JL3.5 JR4 ANG PIG MP (CATHETERS) IMPLANT
CATH LAUNCHER 6FR JR4 (CATHETERS) IMPLANT
DEVICE RAD COMP TR BAND LRG (VASCULAR PRODUCTS) IMPLANT
ELECT DEFIB PAD ADLT CADENCE (PAD) IMPLANT
GLIDESHEATH SLEND A-KIT 6F 22G (SHEATH) IMPLANT
GLIDESHEATH SLEND SS 6F .021 (SHEATH) IMPLANT
GUIDEWIRE INQWIRE 1.5J.035X260 (WIRE) IMPLANT
INQWIRE 1.5J .035X260CM (WIRE) ×1
KIT ENCORE 26 ADVANTAGE (KITS) IMPLANT
KIT HEART LEFT (KITS) ×1 IMPLANT
KIT HEMO VALVE WATCHDOG (MISCELLANEOUS) IMPLANT
PACK CARDIAC CATHETERIZATION (CUSTOM PROCEDURE TRAY) ×1 IMPLANT
SHEATH PROBE COVER 6X72 (BAG) IMPLANT
TRANSDUCER W/STOPCOCK (MISCELLANEOUS) ×1 IMPLANT
TUBING CIL FLEX 10 FLL-RA (TUBING) ×1 IMPLANT
WIRE ASAHI PROWATER 180CM (WIRE) IMPLANT

## 2022-09-11 NOTE — Progress Notes (Signed)
ANTICOAGULATION CONSULT NOTE - Initial Consult  Pharmacy Consult for Heparin Indication: chest pain/ACS  No Known Allergies  Patient Measurements: Height: 5\' 5"  (165.1 cm) IBW/kg (Calculated) : 61.5 Actual BW = 95.45 kg (as reported by EMT on 10/15) Heparin Dosing Weight: 82 kg  Vital Signs: Temp: 98.2 F (36.8 C) (10/15 0003) Temp Source: Oral (10/15 0003) BP: 118/68 (10/15 0100) Pulse Rate: 70 (10/15 0100)  Labs: Recent Labs    09/10/22 2029 09/11/22 0010  HGB 15.4  --   HCT 48.0  --   PLT 260  --   CREATININE 1.16  --   TROPONINIHS 31* 379*    CrCl cannot be calculated (Unknown ideal weight.).   Medical History: Past Medical History:  Diagnosis Date   DM (diabetes mellitus) ([place])    HTN (hypertension)     Medications:  Infusions:   Assessment: 61 yo M presents with chest pain.   No hx CAD, not on anticoagulation PTA.   Troponin elevated and trending up.  CBC WNL.   Goal of Therapy:  Heparin level 0.3-0.7 units/ml Monitor platelets by anticoagulation protocol: Yes   Plan:  Give 4000 units bolus x 1 Start heparin infusion at 1000 units/hr Check anti-Xa level in 6 hours and daily while on heparin Continue to monitor H&H and platelets Noted cardiology plan to transfer to [facility] for cardiac cath on Monday.  Netta Cedars PharmD 09/11/2022,1:31 AM

## 2022-09-11 NOTE — Progress Notes (Signed)
Met with patient at bedside - while CODE STEMI was called.  No emotional or spiritual distress exhibited at [facility] time.      09/11/22 0900  Clinical Encounter Type  Visited With Patient  Visit Type Initial;ED  Referral From Nurse  Consult/Referral To Chaplain  Spiritual Encounters  Spiritual Needs Prayer;Emotional  Stress Factors  Patient Stress Factors None identified  Family Stress Factors None identified

## 2022-09-11 NOTE — ED Provider Notes (Signed)
02:30: Patient arrived from [facility] emergency department as an ED to ED transfer for NSTEMI.  Currently receiving heparin infusion.  Chest pain-free at this time.  Consult placed to cardiology.  Shortly following patient arrival discussed with Dr. Renella Cunas with cardiology- will admit.         Amaryllis Dyke, PA-C 09/11/22 0544    Orpah Greek, MD 09/11/22 [phone]

## 2022-09-11 NOTE — Significant Event (Signed)
BRIEF CARDIOLOGY NOTE  Patient seen and examined this morning in the emergency department.  Patient with ongoing central chest discomfort that is 6 out of 10.  That is despite treatment with multiple doses of nitroglycerin sublingual.  Telemetry personally reviewed shows episodes of nonsustained ventricular tachycardia.  No shortness of breath.  Patient nauseous and had an episode of emesis prior to my arrival.  EKG still shows no evidence of ST elevation.  GEN: Mild distress secondary to pain Neck: No JVD Cardiac: RRR, no murmurs, rubs, or gallops.  Respiratory: Clear to auscultation bilaterally. GI: Soft, nontender, non-distended  MS: No edema; No deformity. Neuro:  Nonfocal  Psych: Normal affect   #NSTEMI High risk.  NSVT and ongoing chest pain despite nitroglycerin.  Hemodynamically stable. I have recommended emergent left heart catheterization.  I discussed the heart cath procedure in detail with the patient including the risks and he wishes to proceed.  We will give aspirin 325 now.  He is already on a heparin drip.  I discussed the case with the on-call interventional cardiologist, Dr. Tamala Julian, who agrees with the plan.  I have personally activated the Cath Lab.  Lysbeth Galas T. Quentin Ore, MD, [facility], [facility] Cardiac Electrophysiology    CRITICAL CARE Performed by: Vickie Epley   Total critical care time: 35 minutes  Critical care time was exclusive of separately billable procedures and treating other patients.  Critical care was necessary to treat or prevent imminent or life-threatening deterioration.  Critical care was time spent personally by me on the following activities: development of treatment plan with patient and/or surrogate as well as nursing, discussions with consultants, evaluation of patient's response to treatment, examination of patient, obtaining history from patient or surrogate, ordering and performing treatments and interventions, ordering and review of  laboratory studies, ordering and review of radiographic studies, pulse oximetry and re-evaluation of patient's condition.

## 2022-09-11 NOTE — Assessment & Plan Note (Deleted)
Cath - small caliber, ? Co-dominant RCA 100% (unable to cross). Echo EF ~55%, inferior posterior hypokinesis

## 2022-09-11 NOTE — H&P (Signed)
Cardiology Admission History and Physical:   Patient ID: [name] MRN: [id number]; DOB: December 22, 1960   Admission date: 09/10/2022  Primary Care Provider: Merryl Hacker No [facility] Cardiologist: None  [facility] Electrophysiologist:  None   Chief Complaint: CP/NSTEMI   Patient Profile:   Benjamin Munoz is a 61 y.o. male with prior severe Covid PNA (10/2020) c/b hypoxic respiratory failure without CAD risk factors who presents with chest pain and elevated hsT c/w NSTEMI.   History of Present Illness:   [name] was finishing yard work yesterday (10/14) around 5:30 PM and got in his car to go home.  On the way home he felt fairly severe chest tightness (9/10 severity) with associated tingling of his left arm.  He had never had anything like this similar occur before.  He had associated nausea and continued to feel worse.  He felt so bad he actually tried to flag down a Engineer, structural on his way home.  He eventually made it to the emergency department several hours later and was found to have an elevated troponin.  His symptoms significantly improved within 10 minutes of SLNG.  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  He has no significant family history for premature CAD or SCD.  VS on ED presentation: P 73, BP 138/68, RR 20, O2 98%/RA.   VS during my evaluation: P 66 BP 99/62 (73), RR 14, O2 100%/RA  During my evaluation he was comfortable and with only mild chest tightness and tingling in the arms.  His nausea had resolved.  He has never had prior cardiac evaluation.  He is not on any outpatient medications.  He does not currently and has not ever used tobacco or EtOH.  Past Medical History:  Diagnosis Date   DM (diabetes mellitus) ([place])    HTN (hypertension)    History reviewed. No pertinent surgical history.   Medications Prior to Admission: Prior to  Admission medications   Medication Sig Start Date End Date Taking? Authorizing Provider  naproxen sodium (ALEVE) 220 MG tablet Take 220 mg by mouth daily as needed (for pain).   Yes [provider]  blood glucose meter kit and supplies Dispense based on patient and insurance preference. Use up to four times daily as directed. (FOR ICD-10 E10.9, E11.9). 11/13/20   Ghimire, Henreitta Leber, MD  metFORMIN (GLUCOPHAGE) 500 MG tablet Take 1 tablet (500 mg total) by mouth 2 (two) times daily with a meal. Patient not taking: Reported on 09/11/2022 11/13/20 11/13/21  Jonetta Osgood, MD    Allergies:   No Known Allergies  Social History:   Social History   Socioeconomic History   Marital status: Married    Spouse name: Not on file   Number of children: Not on file   Years of education: Not on file   Highest education level: Not on file  Occupational History   Not on file  Tobacco Use   Smoking status: Never   Smokeless tobacco: Never  Vaping Use   Vaping Use: Never used  Substance and Sexual Activity   Alcohol use: No   Drug use: No   Sexual activity: Not Currently  Other Topics Concern   Not on file  Social History Narrative   Not on file   Social Determinants of Health   Financial Resource Strain: Not on file  Food Insecurity: Not on file  Transportation Needs: Not  on file  Physical Activity: Not on file  Stress: Not on file  Social Connections: Not on file  Intimate Partner Violence: Not on file    Family History:   The patient's family history is not on file.    ROS:   Review of Systems: [y] = yes, _0  = no      General: Weight gain _1 ; Weight loss _2 ; Anorexia _3 ; Fatigue _4 ; Fever _5 ; Chills _6 ; Weakness _7    Cardiac: Chest pain/pressure [y]; Resting SOB _8 ; Exertional SOB _9 ; Orthopnea _10 ; Pedal Edema _11 ; Palpitations _12 ; Syncope _13 ; Presyncope _14 ; Paroxysmal nocturnal dyspnea _15    Pulmonary: Cough _16 ; Wheezing _17 ; Hemoptysis _18 ; Sputum _19 ;  Snoring _20    GI: Vomiting _21 ; Dysphagia _22 ; Melena _23 ; Hematochezia _24 ; Heartburn _25 ; Abdominal pain _26 ; Constipation _27 ; Diarrhea _28 ; BRBPR _29    GU: Hematuria _30 ; Dysuria _31 ; Nocturia _32  Vascular: Pain in legs with walking _33 ; Pain in feet with lying flat _34 ; Non-healing sores _35 ; Stroke _36 ; TIA _37 ; Slurred speech _38 ;   Neuro: Headaches _39 ; Vertigo _40 ; Seizures _41 ; Paresthesias _42 ;Blurred vision _43 ; Diplopia _44 ; Vision changes _45    Ortho/Skin: Arthritis _46 ; Joint pain _47 ; Muscle pain _48 ; Joint swelling _49 ; Back Pain _50 ; Rash _51    Psych: Depression _52 ; Anxiety _53    Heme: Bleeding problems _54 ; Clotting disorders _55 ; Anemia _56    Endocrine: Diabetes _57 ; Thyroid dysfunction _58    Physical Exam/Data:   Vitals:   09/11/22 0600 09/11/22 0615 09/11/22 0630 09/11/22 0700  BP: 109/61 105/68 114/66 107/64  Pulse: (!) 59 63 61 64  Resp: (!) _59 Temp: 98.1 F (36.7 C)     TempSrc: Oral     SpO2: 98% 95% 98% 97%  Weight:      Height:       No intake or output data in the 24 hours ending 09/11/22 0743    09/11/2022    1:36 AM 11/04/2020   10:26 PM 11/01/2020    9:46 AM  Last 3 Weights  Weight (lbs) 210 lb 210 lb 1.6 oz 210 lb  Weight (kg) 95.255 kg 95.3 kg 95.255 kg     Body mass index is 34.95 kg/m.  General:  Well nourished, well developed, in no acute distress HEENT: normal Lymph: no adenopathy Neck: no JVD Endocrine:  No thryomegaly Vascular: No carotid bruits; FA pulses 2+ bilaterally without bruits  Cardiac:  normal S1, S2; RRR; no murmur  Lungs:  clear to auscultation bilaterally, no wheezing, rhonchi or rales  Abd: soft, nontender, no hepatomegaly  Ext: no edema Musculoskeletal:  No deformities, BUE and BLE strength normal and equal Skin: warm and dry  Neuro:  CNs 2-12 intact, no focal abnormalities noted Psych:  Normal affect   EKG:  The ECG that was done 09/01/22 (06:01:26) was personally reviewed and demonstrates NSR 60, PR  151, QRS 99, QT/Qtc 457/457, no acute ischemic changes   Relevant CV Studies: None   Laboratory Data:  High Sensitivity Troponin:   Recent Labs  Lab 09/10/22 2029 09/11/22 0010 09/11/22 0236 09/11/22 0551  TROPONINIHS 31* 379* 1,037* 1,891*      Chemistry Recent Labs  Lab  09/10/22 2029  NA 138  K 3.9  CL 103  CO2 26  GLUCOSE 182*  BUN 11  CREATININE 1.16  CALCIUM 9.3  GFRNONAA >60  ANIONGAP 9    No results for input(s): "PROT", "ALBUMIN", "AST", "ALT", "ALKPHOS", "BILITOT" in the last 168 hours. Hematology Recent Labs  Lab 09/10/22 2029 09/11/22 0236  WBC 9.6 12.4*  RBC 5.35 5.01  HGB 15.4 14.7  HCT 48.0 44.1  MCV 89.7 88.0  MCH 28.8 29.3  MCHC 32.1 33.3  RDW 13.9 13.8  PLT 260 241   BNP Recent Labs  Lab 09/11/22 0551  BNP 15.9    DDimer No results for input(s): "DDIMER" in the last 168 hours.  Radiology/Studies:  DG Chest 2 View  Result Date: 09/10/2022 CLINICAL DATA:  Chest pain EXAM: CHEST - 2 VIEW COMPARISON:  CTA chest dated 11/09/2020 FINDINGS: Lungs are clear.  No pleural effusion or pneumothorax. The heart is normal in size. Degenerative changes of the visualized thoracolumbar spine. IMPRESSION: Normal chest radiographs. Electronically Signed   By: Julian Hy M.D.   On: 09/10/2022 20:37    TIMI Risk Score for Unstable Angina or Non-ST Elevation MI:   The patient's TIMI risk score is 1, which indicates a 5% risk of all cause mortality, new or recurrent myocardial infarction or need for urgent revascularization in the next 14 days.{  Assessment and Plan:   NSTEMI  DM2 Mr. Shearn presents with a convincing story for ACS and elevated troponin with no other etiology.  He does not have significant risk factors for CAD (other than DM2) however his troponin trend is consistent with NSTEMI. Plan for coronary angiography Monday pending scheduling.  He is currently only with minimal symptoms and did not want additional nitroglycerin as he was  having headaches with the initial dose, his blood pressure is soft but he is otherwise asymptomatic. Well-perfused distal extremities, good UOP and nausea has since resolved so low concern for shock currently. He had elevated A1c c/w DM2, initially thought 2/2 steroid induced DM related to covid infection last year but now still elevated.  - TTE ordered - s/p ASA 324 mg PO (2304), continue ASA 81 mg daily - no P2Y12i pending cath - start metop tartrate 12.5 mg bid  - start atorva 80 mg daily  - start heparin gtt - lipid panel with LDL 152, Lpa pending - BNP nl (15.9), TSH nl (0.476), A1c with DM2 (6.5) not on meds  Severity of Illness: The appropriate patient status for this patient is INPATIENT. Inpatient status is judged to be reasonable and necessary in order to provide the required intensity of service to ensure the patient's safety. The patient's presenting symptoms, physical exam findings, and initial radiographic and laboratory data in the context of their chronic comorbidities is felt to place them at high risk for further clinical deterioration. Furthermore, it is not anticipated that the patient will be medically stable for discharge from the hospital within 2 midnights of admission.   * I certify that at the point of admission it is my clinical judgment that the patient will require inpatient hospital care spanning beyond 2 midnights from the point of admission due to high intensity of service, high risk for further deterioration and high frequency of surveillance required.*   For questions or updates, please contact [place] Please consult [email or web address] for contact info under   Signed, Dion Body, MD  09/11/2022 7:43 AM

## 2022-09-11 NOTE — CV Procedure (Signed)
Non-ST elevation MI with ongoing but lessening symptoms approximately 18 hours post onset of pain. Left coronary is large and codominant with the right coronary. Right coronary has ambiguous anatomy without a clear step.  Very tiny distal right coronary that is seen to fill faint by left-to-right collaterals. Attempted wiring to get in a position compatible with the distal right coronary territory but unable to related to ambiguous anatomy and no clear stump.  Being unable to cross the stenosis in the total occlusion, the case was terminated.  RECOMMENDATIONS: Conservative medical management of this small inferobasal infarct with developing collaterals.  Preventive therapy.  Beta-blocker therapy.  Watch for arrhythmic complications.  Discussed with wife and patient.

## 2022-09-11 NOTE — Progress Notes (Signed)
ANTICOAGULATION CONSULT NOTE - Follow Up Consult  Pharmacy Consult for Heparin Indication: chest pain/ACS  No Known Allergies  Patient Measurements: Height: 5\' 9"  (175.3 cm) Weight: 98.4 kg (216 lb 14.9 oz) IBW/kg (Calculated) : 70.7 Actual BW = 95.45 kg (as reported by EMT on 10/15) Heparin Dosing Weight: 82 kg  Vital Signs: Temp: 98.1 F (36.7 C) (10/15 1605) Temp Source: Oral (10/15 1605) BP: 109/71 (10/15 1500) Pulse Rate: 62 (10/15 1500)  Labs: Recent Labs    09/10/22 2029 09/11/22 0010 09/11/22 0236 09/11/22 0551 09/11/22 0814 09/11/22 1242  HGB 15.4  --  14.7  --   --   --   HCT 48.0  --  44.1  --   --   --   PLT 260  --  241  --   --   --   HEPARINUNFRC  --   --   --  0.32  --   --   CREATININE 1.16  --   --   --   --   --   TROPONINIHS 31*   < > 1,037* 1,891* 3,326* 3,952*   < > = values in this interval not displayed.     Estimated Creatinine Clearance: 77.4 mL/min (by C-G formula based on SCr of 1.16 mg/dL).   Medical History: Past Medical History:  Diagnosis Date   DM (diabetes mellitus) ([place])    HTN (hypertension)     Medications:  Infusions:   sodium chloride 100 mL/hr at 09/11/22 1300   sodium chloride     heparin 1,100 Units/hr (09/11/22 1300)    Assessment: 61 yo M presents with chest pain. Pharmacy consulted for heparin. Patient taken urgently to cath this morning, unfortunately unable to cross distal RCA lesion. Further attempts were aborted. Orders to resume heparin this evening after TR band removed. Of note there was some rebleeding noted during band deflation so removal was prolonged. Band now off.   Goal of Therapy:  Heparin level 0.3-0.7 units/ml Monitor platelets by anticoagulation protocol: Yes   Plan:  Restart heparin at 1000 units/hr given bleeding complications post cath Follow up heparin level in am  Erin Hearing PharmD., BCPS Clinical Pharmacist 09/11/2022 4:59 PM

## 2022-09-11 NOTE — ED Notes (Signed)
Patient reporting recurrence of central chest pain to the back feels like an aching sensation. Captured EKG,  .4 of SL nitro given, paged cardiology with this update. Also paged for critical troponin of 3,326.

## 2022-09-11 NOTE — Progress Notes (Signed)
ANTICOAGULATION CONSULT NOTE - Follow Up Consult  Pharmacy Consult for Heparin Indication: chest pain/ACS  No Known Allergies  Patient Measurements: Height: 5\' 5"  (165.1 cm) Weight: 95.3 kg (210 lb) IBW/kg (Calculated) : 61.5 Actual BW = 95.45 kg (as reported by EMT on 10/15) Heparin Dosing Weight: 82 kg  Vital Signs: Temp: 98.1 F (36.7 C) (10/15 0600) Temp Source: Oral (10/15 0600) BP: 107/64 (10/15 0700) Pulse Rate: 64 (10/15 0700)  Labs: Recent Labs    09/10/22 2029 09/11/22 0010 09/11/22 0236 09/11/22 0551  HGB 15.4  --  14.7  --   HCT 48.0  --  44.1  --   PLT 260  --  241  --   HEPARINUNFRC  --   --   --  0.32  CREATININE 1.16  --   --   --   TROPONINIHS 31* 379* 1,037* 1,891*     Estimated Creatinine Clearance: 70.9 mL/min (by C-G formula based on SCr of 1.16 mg/dL).   Medical History: Past Medical History:  Diagnosis Date   DM (diabetes mellitus) ([place])    HTN (hypertension)     Medications:  Infusions:   heparin 1,000 Units/hr (09/11/22 0206)    Assessment: 61 yo M presents with chest pain. Pharmacy consulted for heparin for NSTEMI. Plan for cath on Monday. Heparin level of 0.32 is therapeutic but drawn ~ 2 hrs early. CBC stable. No bleeding noted per RN  Goal of Therapy:  Heparin level 0.3-0.7 units/ml Monitor platelets by anticoagulation protocol: Yes   Plan:  Increase heparin to 1100 units/hr to ensure remains in therapeutic range Check 6 hr confirmatory heparin level Monitor heparin level, CBC and s/s of bleeding   Cristela Felt, PharmD, BCPS Clinical Pharmacist 09/11/2022 8:44 AM

## 2022-09-12 ENCOUNTER — Encounter (HOSPITAL_COMMUNITY): Payer: Self-pay | Admitting: Interventional Cardiology

## 2022-09-12 DIAGNOSIS — I25110 Atherosclerotic heart disease of native coronary artery with unstable angina pectoris: Secondary | ICD-10-CM

## 2022-09-12 DIAGNOSIS — I4729 Other ventricular tachycardia: Secondary | ICD-10-CM

## 2022-09-12 DIAGNOSIS — I214 Non-ST elevation (NSTEMI) myocardial infarction: Secondary | ICD-10-CM | POA: Diagnosis not present

## 2022-09-12 DIAGNOSIS — E1159 Type 2 diabetes mellitus with other circulatory complications: Secondary | ICD-10-CM

## 2022-09-12 LAB — BASIC METABOLIC PANEL
Anion gap: 8 (ref 5–15)
BUN: 10 mg/dL (ref 8–23)
CO2: 23 mmol/L (ref 22–32)
Calcium: 8.9 mg/dL (ref 8.9–10.3)
Chloride: 106 mmol/L (ref 98–111)
Creatinine, Ser: 1.18 mg/dL (ref 0.61–1.24)
GFR, Estimated: >60 mL/min (ref >60)
Glucose, Bld: 120 mg/dL — ABNORMAL HIGH (ref 70–99)
Potassium: 3.9 mmol/L (ref 3.5–5.1)
Sodium: 137 mmol/L (ref 135–145)

## 2022-09-12 LAB — CBC
HCT: 41.6 % (ref 39.0–52.0)
Hemoglobin: 13.8 g/dL (ref 13.0–17.0)
MCH: 29.1 pg (ref 26.0–34.0)
MCHC: 33.2 g/dL (ref 30.0–36.0)
MCV: 87.8 fL (ref 80.0–100.0)
Platelets: 228 10*3/uL (ref 150–400)
RBC: 4.74 MIL/uL (ref 4.22–5.81)
RDW: 14 % (ref 11.5–15.5)
WBC: 12.3 10*3/uL — ABNORMAL HIGH (ref 4.0–10.5)
nRBC: 0 % (ref 0.0–0.2)

## 2022-09-12 LAB — HEPARIN LEVEL (UNFRACTIONATED)
Heparin Unfractionated: 0.17 IU/mL — ABNORMAL LOW (ref 0.30–0.70)
Heparin Unfractionated: 0.46 IU/mL (ref 0.30–0.70)

## 2022-09-12 MED ORDER — INFLUENZA VAC SPLIT QUAD 0.5 ML IM SUSY: 0.5000 mL | PREFILLED_SYRINGE | Freq: Once | INTRAMUSCULAR | Status: DC

## 2022-09-12 MED ORDER — COVID-19 MRNA 2023-2024 VACCINE (COMIRNATY) 0.3 ML INJECTION
0.3000 mL | Freq: Once | INTRAMUSCULAR | Status: DC
  Filled 2022-09-12 (×2): qty 0.3

## 2022-09-12 NOTE — Progress Notes (Addendum)
Rounding Note    Patient Name: Benjamin Munoz Date of Encounter: 09/12/2022  [place] Cardiologist: None   Patient Profile     Benjamin Munoz is a 61 y.o. male with prior severe Covid PNA (10/2020) c/b hypoxic respiratory failure without CAD risk factors who presents with chest pain and elevated hsT c/w NSTEMI.    Assessment & Plan    Principal Problem:   NSTEMI (non-ST elevated myocardial infarction) [facility]) Active Problems:   Coronary artery disease involving native coronary artery of native heart with unstable angina pectoris (HCC)   NSVT (nonsustained ventricular tachycardia) ([place])   Diabetes mellitus, type II ([place])   Hyperlipidemia associated with type 2 diabetes mellitus ([place])   COVID-19 long hauler manifesting chronic dyspnea/chronic hypoxic respiratory failure  NSTEMI/small V CAD with relatively well controlled DM-2, HLD & borderline BPs: Plan 48 hr IV Heparin DAPT x ~1-3 months then SAPT with Plavix monotherapy to complete 1 yr (since MI with no PCI) Low dose metoprolol 12.5 mg BID - no further NSVT (can hold off on Amiodarone / EPS since this was related to ischemia/MI). High dose statin  Metformin on hold -- SSI if necessary - A1c 6.5 - could consider GLP1a as OP  DISPO: Ambulate today, transfer to Telemetry. Anticipate d/c in AM - ~2-3 after completion of IV Heparin to assess Sx off of AC.   =================================================================== Subjective   Feels well - no further CP or dyspnea.  No palpitations.   Has been up to chair - but not yet ambulated.   Inpatient Medications    Scheduled Meds:  aspirin  81 mg Oral Daily   atorvastatin  80 mg Oral Daily   Chlorhexidine Gluconate Cloth  6 each Topical Daily   clopidogrel  75 mg Oral Q breakfast   COVID-19 mRNA vaccine 2023-2024  0.3 mL Intramuscular Once   influenza vac split quadrivalent PF  0.5 mL Intramuscular Tomorrow-1000   metoprolol tartrate  12.5 mg Oral  BID   sodium chloride flush  3 mL Intravenous Q12H   sodium chloride flush  3 mL Intravenous Q12H   Continuous Infusions:  sodium chloride     heparin 1,300 Units/hr (09/12/22 0700)   PRN Meds: sodium chloride, acetaminophen, nitroGLYCERIN, ondansetron (ZOFRAN) IV, mouth rinse, sodium chloride flush   Vital Signs    Vitals:   09/12/22 0300 09/12/22 0400 09/12/22 0500 09/12/22 0600  BP: (!) 94/57 98/65 106/68 (!) 94/58  Pulse: (!) 57 (!) 55 (!) 58 (!) 55  Resp: 20 16 17 17   Temp: 98.6 F (37 C)     TempSrc: Oral     SpO2: 98% 97% 96% 97%  Weight:   97.7 kg   Height:   5\' 9"  (1.753 m)     Intake/Output Summary (Last 24 hours) at 09/12/2022 0931 Last data filed at 09/12/2022 0700 Gross per 24 hour  Intake 1478 ml  Output 1400 ml  Net 78 ml      09/12/2022    5:00 AM 09/11/2022   11:35 AM 09/11/2022    1:36 AM  Last 3 Weights  Weight (lbs) 215 lb 6.2 oz 216 lb 14.9 oz 210 lb  Weight (kg) 97.7 kg 98.4 kg 95.255 kg      Telemetry    NSR, PVCs  - Personally Reviewed  ECG    S BRady 59, ~ LVH, Fractionated QRS in Inf Leads w/ TWI consider ischemia - Personally Reviewed  Cardiac Studies   Echo EF ~55%, inferior  posterior hypokinesis   Cardiac Cath (unsuccessful PCI) 09/11/22: Cath with mid-distal RCA TO 100% ->unsuccessful PCI. L-R collaterals noted, Co-dominant. Basal Inferior HK, normal LVEF.-  RECOMMENDATIONS: Conservative medical management of this small inferobasal infarct with developing collaterals.  Preventive therapy.  Beta-blocker therapy.  Watch for arrhythmic complications.  Discussed with wife and patient.    Physical Exam   GEN: No acute distress.   Neck: No JVD, or carotid bruit Cardiac: RRR, no murmurs, rubs, or gallops.  Respiratory: Clear to auscultation bilaterally; non-labored, good air movement GI: Soft, nontender, non-distended  MS: No edema; No deformity.; Radial Cath site C/D/I.  + waveform on Pleth, but SaO2 stable on [place] Neuro:   Nonfocal  Psych: Normal affect   Labs    High Sensitivity Troponin:   Recent Labs  Lab 09/11/22 0010 09/11/22 0236 09/11/22 0551 09/11/22 0814 09/11/22 1242  TROPONINIHS 379* 1,037* 1,891* 3,326* 3,952*     Chemistry Recent Labs  Lab 09/10/22 2029 09/12/22 0227  NA 138 137  K 3.9 3.9  CL 103 106  CO2 26 23  GLUCOSE 182* 120*  BUN 11 10  CREATININE 1.16 1.18  CALCIUM 9.3 8.9  GFRNONAA >60 >60  ANIONGAP 9 8    Lipids  Recent Labs  Lab 09/11/22 0236  CHOL 198  TRIG 31  HDL 40*  LDLCALC 152*  CHOLHDL 5.0    Hematology Recent Labs  Lab 09/10/22 2029 09/11/22 0236 09/12/22 0227  WBC 9.6 12.4* 12.3*  RBC 5.35 5.01 4.74  HGB 15.4 14.7 13.8  HCT 48.0 44.1 41.6  MCV 89.7 88.0 87.8  MCH 28.8 29.3 29.1  MCHC 32.1 33.3 33.2  RDW 13.9 13.8 14.0  PLT 260 241 228   Thyroid  Recent Labs  Lab 09/11/22 0236  TSH 0.476    BNP Recent Labs  Lab 09/11/22 0551  BNP 15.9    DDimer No results for input(s): "DDIMER" in the last 168 hours.   Radiology    ECHOCARDIOGRAM COMPLETE  Result Date: 09/11/2022    ECHOCARDIOGRAM REPORT   Patient Name:   Benjamin Munoz Date of Exam: 09/11/2022 Medical Rec #:  [id number]          Height:       69.0 in Accession #:    [id number]         Weight:       216.9 lb Date of Birth:  November 20, 1961          BSA:          2.139 m Patient Age:    67 years           BP:           109/71 mmHg Patient Gender: M                  HR:           58 bpm. Exam Location:  Inpatient Procedure: 2D Echo, Cardiac Doppler, Color Doppler and Intracardiac            Opacification Agent Indications:    NSTEMI  History:        Patient has no prior history of Echocardiogram examinations.                 Risk Factors:Hypertension and Diabetes. Prior COVID infection.  Sonographer:    Clayton Lefort RDCS (AE) Referring Phys: Dion Body  Sonographer Comments: Patient is obese and no subcostal window. Image acquisition challenging due to patient  body habitus.  IMPRESSIONS  1. Left ventricular ejection fraction, by estimation, is 55%. The left ventricle has normal function. The left ventricle demonstrates regional wall motion abnormalities (see scoring diagram/findings for description). Left ventricular diastolic parameters were normal.  2. Right ventricular systolic function is mildly reduced. The right ventricular size is normal. Tricuspid regurgitation signal is inadequate for assessing PA pressure.  3. The mitral valve is normal in structure. Trivial mitral valve regurgitation. No evidence of mitral stenosis.  4. The aortic valve is tricuspid. There is mild calcification of the aortic valve. Aortic valve regurgitation is trivial. No aortic stenosis is present.  5. The inferior vena cava is normal in size with greater than 50% respiratory variability, suggesting right atrial pressure of 3 mmHg. FINDINGS  Left Ventricle: Left ventricular ejection fraction, by estimation, is 55%. The left ventricle has normal function. The left ventricle demonstrates regional wall motion abnormalities. The left ventricular internal cavity size was normal in size. There is  no left ventricular hypertrophy. Left ventricular diastolic parameters were normal.  LV Wall Scoring: The inferior wall and posterior wall are hypokinetic. Right Ventricle: The right ventricular size is normal. No increase in right ventricular wall thickness. Right ventricular systolic function is mildly reduced. Tricuspid regurgitation signal is inadequate for assessing PA pressure. Left Atrium: Left atrial size was normal in size. Right Atrium: Right atrial size was normal in size. Pericardium: There is no evidence of pericardial effusion. Mitral Valve: The mitral valve is normal in structure. Trivial mitral valve regurgitation. No evidence of mitral valve stenosis. Tricuspid Valve: The tricuspid valve is normal in structure. Tricuspid valve regurgitation is trivial. No evidence of tricuspid stenosis. Aortic Valve:  The aortic valve is tricuspid. There is mild calcification of the aortic valve. Aortic valve regurgitation is trivial. No aortic stenosis is present. Aortic valve mean gradient measures 3.0 mmHg. Aortic valve peak gradient measures 5.1 mmHg. Aortic valve area, by VTI measures 2.30 cm. Pulmonic Valve: The pulmonic valve was normal in structure. Pulmonic valve regurgitation is trivial. No evidence of pulmonic stenosis. Aorta: The aortic root is normal in size and structure. Ascending aorta measurements are within normal limits for age when indexed to body surface area. Venous: The inferior vena cava was not well visualized. The inferior vena cava is normal in size with greater than 50% respiratory variability, suggesting right atrial pressure of 3 mmHg. IAS/Shunts: The interatrial septum was not well visualized.  LEFT VENTRICLE PLAX 2D LVIDd:         5.30 cm   Diastology LVIDs:         3.40 cm   LV e' medial:    8.16 cm/s LV PW:         0.90 cm   LV E/e' medial:  8.7 LV IVS:        0.90 cm   LV e' lateral:   8.59 cm/s LVOT diam:     2.00 cm   LV E/e' lateral: 8.3 LV SV:         57 LV SV Index:   26 LVOT Area:     3.14 cm  RIGHT VENTRICLE RV Basal diam:  3.40 cm RV S prime:     13.10 cm/s TAPSE (M-mode): 2.1 cm LEFT ATRIUM             Index        RIGHT ATRIUM           Index LA diam:        3.50  cm 1.64 cm/m   RA Area:     14.40 cm LA Vol (A2C):   42.9 ml 20.06 ml/m  RA Volume:   34.10 ml  15.94 ml/m LA Vol (A4C):   29.2 ml 13.65 ml/m LA Biplane Vol: 37.8 ml 17.68 ml/m  AORTIC VALVE AV Area (Vmax):    2.32 cm AV Area (Vmean):   2.26 cm AV Area (VTI):     2.30 cm AV Vmax:           113.00 cm/s AV Vmean:          74.900 cm/s AV VTI:            0.246 m AV Peak Grad:      5.1 mmHg AV Mean Grad:      3.0 mmHg LVOT Vmax:         83.60 cm/s LVOT Vmean:        53.900 cm/s LVOT VTI:          0.180 m LVOT/AV VTI ratio: 0.73  AORTA Ao Root diam: 3.60 cm Ao Asc diam:  3.80 cm MITRAL VALVE MV Area (PHT): 2.46 cm     SHUNTS MV Decel Time: 309 msec    Systemic VTI:  0.18 m MV E velocity: 71.00 cm/s  Systemic Diam: 2.00 cm MV A velocity: 73.30 cm/s MV E/A ratio:  0.97 Cherlynn Kaiser MD Electronically signed by Cherlynn Kaiser MD Signature Date/Time: 09/11/2022/4:28:05 PM    Final    CARDIAC CATHETERIZATION  Result Date: 09/11/2022 CONCLUSIONS: Non-ST elevation MI due to occlusion of the mid to distal RCA, with ambiguous anatomy, absence of a stab, and chest pain greater than 18 hours.  Unable to advance a PCI wire into a territory that was consistent with the distal right coronary.  PCI attempts were aborted due to adverse risk benefit ratio. Left coronary reveals LAD that is transapical and supplies 1/3-1/2 of the inferior interventricular wall.  Circumflex territory is also large and widely patent. Faint distal collaterals to tiny distal RCA. LV gram reveals small region of inferobasal severe hypokinesis.  EF greater than 60%. RECOMMENDATIONS: Dual antiplatelet therapy due to acute coronary syndrome with aspirin and Plavix Guideline directed preventive therapy for ischemic heart disease Low-dose beta-blocker therapy Watch for potential conduction/rhythm disturbance related to completed infarction.   DG Chest 2 View  Result Date: 09/10/2022 CLINICAL DATA:  Chest pain EXAM: CHEST - 2 VIEW COMPARISON:  CTA chest dated 11/09/2020 FINDINGS: Lungs are clear.  No pleural effusion or pneumothorax. The heart is normal in size. Degenerative changes of the visualized thoracolumbar spine. IMPRESSION: Normal chest radiographs. Electronically Signed   By: Julian Hy M.D.   On: 09/10/2022 20:37      For questions or updates, please contact [place] Please consult [email or web address] for contact info under        Signed, Glenetta Hew, MD  09/12/2022, 9:31 AM

## 2022-09-12 NOTE — Progress Notes (Signed)
ANTICOAGULATION CONSULT NOTE - Follow Up Consult  Pharmacy Consult for heparin Indication:  NSTEMI  Labs: Recent Labs    09/10/22 2029 09/11/22 0010 09/11/22 0236 09/11/22 0551 09/11/22 0814 09/11/22 1242 09/12/22 0227  HGB 15.4  --  14.7  --   --   --  13.8  HCT 48.0  --  44.1  --   --   --  41.6  PLT 260  --  241  --   --   --  228  HEPARINUNFRC  --   --   --  0.32  --   --  0.17*  CREATININE 1.16  --   --   --   --   --  1.18  TROPONINIHS 31*   < > 1,037* 1,891* 3,326* 3,952*  --    < > = values in this interval not displayed.    Assessment: 61yo male subtherapeutic on heparin after resumed post-cath; no infusion issues or signs of bleeding per RN.  Goal of Therapy:  Heparin level 0.3-0.7 units/ml   Plan:  Will increase heparin infusion by 3 units/kg/hr to 1300 units/hr and check level in 6 hours.    Wynona Neat, PharmD, BCPS  09/12/2022,4:56 AM

## 2022-09-12 NOTE — Progress Notes (Signed)
ANTICOAGULATION CONSULT NOTE - Follow Up Consult  Pharmacy Consult for Heparin Indication: chest pain/ACS  No Known Allergies  Patient Measurements: Height: 5\' 9"  (175.3 cm) Weight: 97.7 kg (215 lb 6.2 oz) IBW/kg (Calculated) : 70.7 Actual BW = 95.45 kg (as reported by EMT on 10/15) Heparin Dosing Weight: 82 kg  Vital Signs: Temp: 98.4 F (36.9 C) (10/16 1100) Temp Source: Oral (10/16 1100) BP: 102/63 (10/16 1000) Pulse Rate: 57 (10/16 1100)  Labs: Recent Labs    09/10/22 2029 09/11/22 0010 09/11/22 0236 09/11/22 0551 09/11/22 0814 09/11/22 1242 09/12/22 0227 09/12/22 1041  HGB 15.4  --  14.7  --   --   --  13.8  --   HCT 48.0  --  44.1  --   --   --  41.6  --   PLT 260  --  241  --   --   --  228  --   HEPARINUNFRC  --   --   --  0.32  --   --  0.17* 0.46  CREATININE 1.16  --   --   --   --   --  1.18  --   TROPONINIHS 31*   < > 1,037* 1,891* 3,326* 3,952*  --   --    < > = values in this interval not displayed.    Estimated Creatinine Clearance: 75.8 mL/min (by C-G formula based on SCr of 1.18 mg/dL).   Medical History: Past Medical History:  Diagnosis Date   Coronary artery disease involving native coronary artery of native heart with unstable angina pectoris ([place]) 09/11/2022   Cath with mid-distal RCA TO 100% ->unsuccessful PCI. L-R collaterals noted, Co-dominant. Basal Inferior HK, normal LVEF     COVID-19 long hauler manifesting chronic dyspnea/chronic hypoxic respiratory failure 11/04/2020   Diabetes mellitus, type II ([place]) 09/11/2022   Hyperlipidemia associated with type 2 diabetes mellitus ([place]) 09/11/2022   NSTEMI (non-ST elevated myocardial infarction) ([place]) 09/11/2022   Cath - small caliber, ? Co-dominant RCA 100% (unable to cross). Echo EF ~55%, inferior posterior hypokinesis     Medications:  Infusions:   sodium chloride     heparin 1,300 Units/hr (09/12/22 0700)    Assessment: 61 yo M presents with chest pain. Pharmacy consulted for  heparin. Patient taken urgently to cath this morning, unfortunately unable to cross distal RCA lesion. Further attempts were aborted. Orders to resume heparin post cath. Per discussion with cardiology, will continue heparin for a total of 48 hours. Order contains end date.   Heparin level 0.46 on drip rate 1300 units/hr therapeutic. Hgb 13.8 and plt 228. No s/sx bleeding noted in chart.    Goal of Therapy:  Heparin level 0.3-0.7 units/ml Monitor platelets by anticoagulation protocol: Yes   Plan:  Continue heparin infusion at 1300 units/hr  Heparin to end at 10/17 0200 for 48 hour duration  Continue to monitor H&H     Gena Fray, PharmD PGY1 Pharmacy Resident   09/12/2022 12:27 PM

## 2022-09-12 NOTE — Progress Notes (Signed)
CARDIAC REHAB PHASE I   PRE:  Rate/Rhythm: 66 SR    BP: sitting 110/57    SaO2: 98 RA  MODE:  Ambulation: 410 ft   POST:  Rate/Rhythm: 80 SR    BP: sitting 109/59     SaO2: 100 RA  Tolerated well. Second walk today. Discussed with pt and wife MI, restrictions, diet, exercise, NTG and CRPII. Pt receptive. Discussed his A1C as well. Will refer to [place].  [place] BS, ACSM-CEP 09/12/2022 2:04 PM

## 2022-09-13 ENCOUNTER — Other Ambulatory Visit (HOSPITAL_COMMUNITY): Payer: Self-pay

## 2022-09-13 ENCOUNTER — Telehealth (HOSPITAL_COMMUNITY): Payer: Self-pay | Admitting: Pharmacy Technician

## 2022-09-13 DIAGNOSIS — I25110 Atherosclerotic heart disease of native coronary artery with unstable angina pectoris: Secondary | ICD-10-CM | POA: Diagnosis not present

## 2022-09-13 DIAGNOSIS — I4729 Other ventricular tachycardia: Secondary | ICD-10-CM | POA: Diagnosis not present

## 2022-09-13 DIAGNOSIS — E785 Hyperlipidemia, unspecified: Secondary | ICD-10-CM

## 2022-09-13 DIAGNOSIS — I214 Non-ST elevation (NSTEMI) myocardial infarction: Secondary | ICD-10-CM | POA: Diagnosis not present

## 2022-09-13 DIAGNOSIS — E1159 Type 2 diabetes mellitus with other circulatory complications: Secondary | ICD-10-CM | POA: Diagnosis not present

## 2022-09-13 DIAGNOSIS — E1169 Type 2 diabetes mellitus with other specified complication: Secondary | ICD-10-CM

## 2022-09-13 LAB — CBC
HCT: 39.7 % (ref 39.0–52.0)
Hemoglobin: 13.6 g/dL (ref 13.0–17.0)
MCH: 29.8 pg (ref 26.0–34.0)
MCHC: 34.3 g/dL (ref 30.0–36.0)
MCV: 86.9 fL (ref 80.0–100.0)
Platelets: 217 10*3/uL (ref 150–400)
RBC: 4.57 MIL/uL (ref 4.22–5.81)
RDW: 13.9 % (ref 11.5–15.5)
WBC: 12.2 10*3/uL — ABNORMAL HIGH (ref 4.0–10.5)
nRBC: 0 % (ref 0.0–0.2)

## 2022-09-13 LAB — LIPOPROTEIN A (LPA)
Lipoprotein (a): 59.4 nmol/L — ABNORMAL HIGH (ref <75.0)
Lipoprotein (a): 60.8 nmol/L — ABNORMAL HIGH (ref <75.0)

## 2022-09-13 MED ORDER — DAPAGLIFLOZIN PROPANEDIOL 10 MG PO TABS
10.0000 mg | ORAL_TABLET | Freq: Every day | ORAL | Status: DC
  Administered 2022-09-13: 10 mg via ORAL
  Filled 2022-09-13: qty 1

## 2022-09-13 MED ORDER — NITROGLYCERIN 0.4 MG SL SUBL
0.4000 mg | SUBLINGUAL_TABLET | SUBLINGUAL | 2 refills | Status: AC | PRN
  Filled 2022-09-13: qty 25, 7d supply, fill #0

## 2022-09-13 MED ORDER — METOPROLOL TARTRATE 25 MG PO TABS
12.5000 mg | ORAL_TABLET | Freq: Two times a day (BID) | ORAL | 0 refills | Status: DC
  Filled 2022-09-13: qty 90, 90d supply, fill #0

## 2022-09-13 MED ORDER — DAPAGLIFLOZIN PROPANEDIOL 10 MG PO TABS
10.0000 mg | ORAL_TABLET | Freq: Every day | ORAL | 0 refills | Status: AC
  Filled 2022-09-13: qty 30, 30d supply, fill #0

## 2022-09-13 MED ORDER — ASPIRIN 81 MG PO CHEW
81.0000 mg | CHEWABLE_TABLET | Freq: Every day | ORAL | 2 refills | Status: AC
  Filled 2022-09-13: qty 90, 90d supply, fill #0

## 2022-09-13 MED ORDER — ATORVASTATIN CALCIUM 80 MG PO TABS
80.0000 mg | ORAL_TABLET | Freq: Every day | ORAL | 1 refills | Status: AC
  Filled 2022-09-13: qty 90, 90d supply, fill #0

## 2022-09-13 MED ORDER — CLOPIDOGREL BISULFATE 75 MG PO TABS
75.0000 mg | ORAL_TABLET | Freq: Every day | ORAL | 2 refills | Status: DC
  Filled 2022-09-13: qty 90, 90d supply, fill #0

## 2022-09-13 NOTE — Progress Notes (Signed)
Pt D/C education completed. All questions answered. Pt IV's d/c'd. Hemostasis achieved. Pt wheeled to private vehicle.

## 2022-09-13 NOTE — Progress Notes (Signed)
Rounding Note    Patient Name: Benjamin Munoz Date of Encounter: 09/13/2022  [place] Cardiologist: None   Patient Profile     Benjamin Munoz is a 61 y.o. male with prior severe Covid PNA (10/2020) c/b hypoxic respiratory failure without CAD risk factors who presents with chest pain and elevated hsT c/w NSTEMI.    Assessment & Plan    Principal Problem:   NSTEMI (non-ST elevated myocardial infarction) ([place]) Active Problems:   Coronary artery disease involving native coronary artery of native heart with unstable angina pectoris (HCC)   NSVT (nonsustained ventricular tachycardia) ([place])   Diabetes mellitus, type II ([place])   Hyperlipidemia associated with type 2 diabetes mellitus ([place])   COVID-19 long hauler manifesting chronic dyspnea/chronic hypoxic respiratory failure  NSTEMI/small V CAD with relatively well controlled DM-2, HLD & borderline BPs: Plan 48 hr IV Heparin = off today DAPT (ASA/Plavix) x ~1-3 months then [facility] with Plavix monotherapy to complete 1 yr (since MI with no PCI)  Low dose metoprolol 12.5 mg BID - unable to titrate further at this point due to low BP in 100s & HR in 60s.   no further NSVT (can hold off on Amiodarone / EPS since this was related to ischemia/MI). If BP tolerates, consider ARB as OP.  High dose statin  Metformin on hold -- covering with SSI - A1c 6.5; will try to  SGLT2-I (doing cost assessment - may need Co-Pay cards) & consider GLP1a as OP;  restart Metformin on d/c   DISPO: Ambulate today off of IV Heparin - if does well - plan d/c Later today  Works as Therapist, music for [employer] --> will need work note for coverage until seen in OP f/u.  =================================================================== Subjective   Doing well - has been up & around today off of Heparin - tolerating well. No CP or Dyspnea No arrhythmia   Inpatient Medications    Scheduled Meds:  aspirin  81 mg Oral  Daily   atorvastatin  80 mg Oral Daily   Chlorhexidine Gluconate Cloth  6 each Topical Daily   clopidogrel  75 mg Oral Q breakfast   COVID-19 mRNA vaccine 2023-2024  0.3 mL Intramuscular Once   influenza vac split quadrivalent PF  0.5 mL Intramuscular Once   metoprolol tartrate  12.5 mg Oral BID   sodium chloride flush  3 mL Intravenous Q12H   sodium chloride flush  3 mL Intravenous Q12H   Continuous Infusions:  sodium chloride     PRN Meds: sodium chloride, acetaminophen, nitroGLYCERIN, ondansetron (ZOFRAN) IV, mouth rinse, sodium chloride flush   Vital Signs    Vitals:   09/13/22 0600 09/13/22 0650 09/13/22 0700 09/13/22 0830  BP: 108/63 (!) 106/54 (!) 107/56   Pulse: 64     Resp: 19 19 20    Temp:    98.6 F (37 C)  TempSrc:    Oral  SpO2: 99%     Weight:      Height:        Intake/Output Summary (Last 24 hours) at 09/13/2022 1017 Last data filed at 09/13/2022 0500 Gross per 24 hour  Intake 690.5 ml  Output 975 ml  Net -284.5 ml      09/13/2022    5:00 AM 09/12/2022    5:00 AM 09/11/2022   11:35 AM  Last 3 Weights  Weight (lbs) 216 lb 0.8 oz 215 lb 6.2 oz 216 lb 14.9 oz  Weight (kg) 98 kg 97.7 kg  98.4 kg      Telemetry    NSR - Personally Reviewed  ECG    No EKG today Personally Reviewed  Cardiac Studies   Echo EF ~55%, inferior posterior hypokinesis   Cardiac Cath (unsuccessful PCI) 09/11/22: Cath with mid-distal RCA TO 100% ->unsuccessful PCI. L-R collaterals noted, Co-dominant. Basal Inferior HK, normal LVEF.-  RECOMMENDATIONS: Conservative medical management of this small inferobasal infarct with developing collaterals.  Preventive therapy.  Beta-blocker therapy.  Watch for arrhythmic complications.  Discussed with wife and patient.    Physical Exam   GEN: No acute distress.  - Resting comfortably.  Neck: No JVD, or carotid bruit Cardiac: RRR, noM/R/G  Respiratory CTAB; non-labored, good air movement GI: Soft,NT/ND/NABS MS: NoC/C/E/   Radial Cath site C/D/I.\ Psych: Normal mood & affect    Labs    High Sensitivity Troponin:   Recent Labs  Lab 09/11/22 0010 09/11/22 0236 09/11/22 0551 09/11/22 0814 09/11/22 1242  TROPONINIHS 379* 1,037* 1,891* 3,326* 3,952*     Chemistry Recent Labs  Lab 09/10/22 2029 09/12/22 0227  NA 138 137  K 3.9 3.9  CL 103 106  CO2 26 23  GLUCOSE 182* 120*  BUN 11 10  CREATININE 1.16 1.18  CALCIUM 9.3 8.9  GFRNONAA >60 >60  ANIONGAP 9 8    Lipids  Recent Labs  Lab 09/11/22 0236  CHOL 198  TRIG 31  HDL 40*  LDLCALC 152*  CHOLHDL 5.0    Hematology Recent Labs  Lab 09/11/22 0236 09/12/22 0227 09/13/22 0440  WBC 12.4* 12.3* 12.2*  RBC 5.01 4.74 4.57  HGB 14.7 13.8 13.6  HCT 44.1 41.6 39.7  MCV 88.0 87.8 86.9  MCH 29.3 29.1 29.8  MCHC 33.3 33.2 34.3  RDW 13.8 14.0 13.9  PLT 241 228 217   Thyroid  Recent Labs  Lab 09/11/22 0236  TSH 0.476    BNP Recent Labs  Lab 09/11/22 0551  BNP 15.9    DDimer No results for input(s): "DDIMER" in the last 168 hours.   Radiology    ECHOCARDIOGRAM COMPLETE  Result Date: 09/11/2022    ECHOCARDIOGRAM REPORT   Patient Name:   Benjamin Munoz Date of Exam: 09/11/2022 Medical Rec #:  [id number]          Height:       69.0 in Accession #:    [id number]         Weight:       216.9 lb Date of Birth:  1961-05-07          BSA:          2.139 m Patient Age:    62 years           BP:           109/71 mmHg Patient Gender: M                  HR:           58 bpm. Exam Location:  Inpatient Procedure: 2D Echo, Cardiac Doppler, Color Doppler and Intracardiac            Opacification Agent Indications:    NSTEMI  History:        Patient has no prior history of Echocardiogram examinations.                 Risk Factors:Hypertension and Diabetes. Prior COVID infection.  Sonographer:    Clayton Lefort RDCS (AE) Referring Phys: [place]  Comments: Patient is obese and no subcostal window. Image acquisition challenging due to  patient body habitus. IMPRESSIONS  1. Left ventricular ejection fraction, by estimation, is 55%. The left ventricle has normal function. The left ventricle demonstrates regional wall motion abnormalities (see scoring diagram/findings for description). Left ventricular diastolic parameters were normal.  2. Right ventricular systolic function is mildly reduced. The right ventricular size is normal. Tricuspid regurgitation signal is inadequate for assessing PA pressure.  3. The mitral valve is normal in structure. Trivial mitral valve regurgitation. No evidence of mitral stenosis.  4. The aortic valve is tricuspid. There is mild calcification of the aortic valve. Aortic valve regurgitation is trivial. No aortic stenosis is present.  5. The inferior vena cava is normal in size with greater than 50% respiratory variability, suggesting right atrial pressure of 3 mmHg. FINDINGS  Left Ventricle: Left ventricular ejection fraction, by estimation, is 55%. The left ventricle has normal function. The left ventricle demonstrates regional wall motion abnormalities. The left ventricular internal cavity size was normal in size. There is  no left ventricular hypertrophy. Left ventricular diastolic parameters were normal.  LV Wall Scoring: The inferior wall and posterior wall are hypokinetic. Right Ventricle: The right ventricular size is normal. No increase in right ventricular wall thickness. Right ventricular systolic function is mildly reduced. Tricuspid regurgitation signal is inadequate for assessing PA pressure. Left Atrium: Left atrial size was normal in size. Right Atrium: Right atrial size was normal in size. Pericardium: There is no evidence of pericardial effusion. Mitral Valve: The mitral valve is normal in structure. Trivial mitral valve regurgitation. No evidence of mitral valve stenosis. Tricuspid Valve: The tricuspid valve is normal in structure. Tricuspid valve regurgitation is trivial. No evidence of tricuspid  stenosis. Aortic Valve: The aortic valve is tricuspid. There is mild calcification of the aortic valve. Aortic valve regurgitation is trivial. No aortic stenosis is present. Aortic valve mean gradient measures 3.0 mmHg. Aortic valve peak gradient measures 5.1 mmHg. Aortic valve area, by VTI measures 2.30 cm. Pulmonic Valve: The pulmonic valve was normal in structure. Pulmonic valve regurgitation is trivial. No evidence of pulmonic stenosis. Aorta: The aortic root is normal in size and structure. Ascending aorta measurements are within normal limits for age when indexed to body surface area. Venous: The inferior vena cava was not well visualized. The inferior vena cava is normal in size with greater than 50% respiratory variability, suggesting right atrial pressure of 3 mmHg. IAS/Shunts: The interatrial septum was not well visualized.  LEFT VENTRICLE PLAX 2D LVIDd:         5.30 cm   Diastology LVIDs:         3.40 cm   LV e' medial:    8.16 cm/s LV PW:         0.90 cm   LV E/e' medial:  8.7 LV IVS:        0.90 cm   LV e' lateral:   8.59 cm/s LVOT diam:     2.00 cm   LV E/e' lateral: 8.3 LV SV:         57 LV SV Index:   26 LVOT Area:     3.14 cm  RIGHT VENTRICLE RV Basal diam:  3.40 cm RV S prime:     13.10 cm/s TAPSE (M-mode): 2.1 cm LEFT ATRIUM             Index        RIGHT ATRIUM  Index LA diam:        3.50 cm 1.64 cm/m   RA Area:     14.40 cm LA Vol (A2C):   42.9 ml 20.06 ml/m  RA Volume:   34.10 ml  15.94 ml/m LA Vol (A4C):   29.2 ml 13.65 ml/m LA Biplane Vol: 37.8 ml 17.68 ml/m  AORTIC VALVE AV Area (Vmax):    2.32 cm AV Area (Vmean):   2.26 cm AV Area (VTI):     2.30 cm AV Vmax:           113.00 cm/s AV Vmean:          74.900 cm/s AV VTI:            0.246 m AV Peak Grad:      5.1 mmHg AV Mean Grad:      3.0 mmHg LVOT Vmax:         83.60 cm/s LVOT Vmean:        53.900 cm/s LVOT VTI:          0.180 m LVOT/AV VTI ratio: 0.73  AORTA Ao Root diam: 3.60 cm Ao Asc diam:  3.80 cm MITRAL VALVE MV  Area (PHT): 2.46 cm    SHUNTS MV Decel Time: 309 msec    Systemic VTI:  0.18 m MV E velocity: 71.00 cm/s  Systemic Diam: 2.00 cm MV A velocity: 73.30 cm/s MV E/A ratio:  0.97 Cherlynn Kaiser MD Electronically signed by Cherlynn Kaiser MD Signature Date/Time: 09/11/2022/4:28:05 PM    Final    CARDIAC CATHETERIZATION  Result Date: 09/11/2022 CONCLUSIONS: Non-ST elevation MI due to occlusion of the mid to distal RCA, with ambiguous anatomy, absence of a stab, and chest pain greater than 18 hours.  Unable to advance a PCI wire into a territory that was consistent with the distal right coronary.  PCI attempts were aborted due to adverse risk benefit ratio. Left coronary reveals LAD that is transapical and supplies 1/3-1/2 of the inferior interventricular wall.  Circumflex territory is also large and widely patent. Faint distal collaterals to tiny distal RCA. LV gram reveals small region of inferobasal severe hypokinesis.  EF greater than 60%. RECOMMENDATIONS: Dual antiplatelet therapy due to acute coronary syndrome with aspirin and Plavix Guideline directed preventive therapy for ischemic heart disease Low-dose beta-blocker therapy Watch for potential conduction/rhythm disturbance related to completed infarction.      For questions or updates, please contact [place] Please consult [email or web address] for contact info under        Signed, Glenetta Hew, MD  09/13/2022, 10:17 AM

## 2022-09-13 NOTE — Progress Notes (Signed)
Pt has been walking independently w/o chest pain. educated pt on watching symptoms, NTG use, Aspirin and Plavix use, and CRPII.   Christen Bame 09/13/2022 1:32 PM  [id number]

## 2022-09-13 NOTE — Telephone Encounter (Signed)
Pharmacy Patient Advocate Encounter  Insurance verification completed.    The patient is insured through [employer]   The patient is currently admitted and ran test claims for the following: Jardiance, Farxiga.  Copays and coinsurance results were relayed to Inpatient clinical team.

## 2022-09-13 NOTE — Discharge Summary (Cosign Needed)
Discharge Summary    Patient ID: Benjamin Munoz MRN: [id number]; DOB: [birth year]  Admit date: 09/10/2022 Discharge date: 09/13/2022  PCP:  Benjamin Munoz, No   [place] Providers Cardiologist:  Glenetta Hew, MD   {  Discharge Diagnoses    Principal Problem:   NSTEMI (non-ST elevated myocardial infarction) [facility]) Active Problems:   COVID-19 long hauler manifesting chronic dyspnea/chronic hypoxic respiratory failure   Diabetes mellitus, type II ([place])   Hyperlipidemia associated with type 2 diabetes mellitus ([place])   Coronary artery disease involving native coronary artery of native heart with unstable angina pectoris (HCC)   NSVT (nonsustained ventricular tachycardia) ([place]) Rate Related Right Bundle Branch Block   Diagnostic Studies/Procedures    Cath: 09/11/22  CONCLUSIONS: Non-ST elevation MI due to occlusion of the mid to distal RCA, with ambiguous anatomy, absence of a stab, and chest pain greater than 18 hours.  Unable to advance a PCI wire into a territory that was consistent with the distal right coronary.  PCI attempts were aborted due to adverse risk benefit ratio. Left coronary reveals LAD that is transapical and supplies 1/3-1/2 of the inferior interventricular wall.   Circumflex territory is also large and widely patent. Faint distal collaterals to tiny distal RCA. LV gram reveals small region of inferobasal severe hypokinesis.  EF greater than 60%. RECOMMENDATIONS: Dual antiplatelet therapy due to acute coronary syndrome with aspirin and Plavix Guideline directed preventive therapy for ischemic heart disease Low-dose beta-blocker therapy Watch for potential conduction/rhythm disturbance related to completed infarction.  Diagnostic: Dominance: Right     Intervention   _____________   Echo EF ~55%, inferior posterior hypokinesis    History of Present Illness     Benjamin Munoz is a 61 y.o. male with prior severe Covid PNA (10/2020) c/b hypoxic  respiratory failure without CAD risk factors who presented with chest pain and elevated hsT c/w NSTEMI.   Benjamin Munoz was finishing yard work yesterday (10/14) around 5:30 PM and got in his car to go home.  On the way home he felt fairly severe chest tightness (9/10 severity) with associated tingling of his left arm.  He had never had anything like this similar occur before.  He had associated nausea and continued to feel worse.  He felt so bad he actually tried to flag down a Engineer, structural on his way home.  He eventually made it to the emergency department several hours later and was found to have an elevated troponin.  His symptoms significantly improved within 10 minutes of SLNG.  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  He has no significant family history for premature CAD or SCD.   VS on ED presentation: P 73, BP 138/68, RR 20, O2 98%/RA.    VS during my evaluation: P 66 BP 99/62 (73), RR 14, O2 100%/RA   During my evaluation he was comfortable and with only mild chest tightness and tingling in the arms.  His nausea had resolved.  He has never had prior cardiac evaluation.  He is not on any outpatient medications.  He does not currently and has not ever used tobacco or EtOH.    Hospital Course     NSTEMI -- hsTn peaked at 3952, underwent cardiac cath noted above with occlusion of m/dRCA but unable to advance PCI wire into territory. Attempts were aborted. Faint distal collaterals to dRCA. Recommendations for medical therapy. Placed on  DAPT with ASA, plavix, along with statin and BB. Follow up echo showed LVEF of 55% with hypokinetic inferior wall. Seen by cardiac rehab.  -- on ASA, plavix , only able to tolerate low-dose metoprolol, statin DAPT (ASA/Plavix) x ~1-3 months then [facility] with Plavix monotherapy to complete 1 yr (since MI with no PCI)   HLD -- LDL 152, HDL 40, LP(a)  60.8 -- started on atorvastatin 64m daily  -- will need FLP/LFTs in 8 weeks => anticipate that he may benefit from PCSK9 inhibitor.  DM -- Hgb A1c 6.5 -- resumed on metformin, add farxgia at discharge    Rate-related RBBB: My initial thought was that he may have had some AIVR, but on review, there are clear P waves making this consistent with a rate related right bundle branch block.  A benign finding that is probably related to right bundle injury from his MI.  Would not be surprised if he ends up with a complete right bundle branch block in the future.  Patient was seen by Dr. HEllyn Hackand deemed stable for discharge home. Follow up arranged in the office. [facility] Educated by pharmD prior to discharge.   Did the patient have an acute coronary syndrome (MI, NSTEMI, STEMI, etc) this admission?:  Yes                               AHA/ACC Clinical Performance & Quality Measures: Aspirin prescribed? - Yes ADP Receptor Inhibitor (Plavix/Clopidogrel, Brilinta/Ticagrelor or Effient/Prasugrel) prescribed (includes medically managed patients)? - Yes Beta Blocker prescribed? - Yes High Intensity Statin (Lipitor 40-846mor Crestor 20-4046mprescribed? - Yes EF assessed during THIS hospitalization? - Yes For EF <40%, was ACEI/ARB prescribed? - Not Applicable (EF >/= 40%86%or EF <40%, Aldosterone Antagonist (Spironolactone or Eplerenone) prescribed? - Not Applicable (EF >/= 40%57%ardiac Rehab Phase II ordered (including medically managed patients)? - Yes       The patient will be scheduled for a TOC follow up appointment in 10-14 days.  A [facility] at the office where the patient should be seen for follow up.  _____________  Discharge Vitals Blood pressure 102/62, pulse 64, temperature 98.4 F (36.9 C), temperature source Oral, resp. rate 19, height 5' 9" (1.753 m), weight 98 kg, SpO2 99 %.  Filed Weights   09/11/22  1135 09/12/22 0500 09/13/22 0500  Weight: 98.4 kg 97.7 kg 98 kg    Labs & Radiologic Studies    CBC Recent Labs    09/12/22 0227 09/13/22 0440  WBC 12.3* 12.2*  HGB 13.8 13.6  HCT 41.6 39.7  MCV 87.8 86.9  PLT 228 217846Basic Metabolic Panel Recent Labs    09/10/22 2029 09/12/22 0227  NA 138 137  K 3.9 3.9  CL 103 106  CO2 26 23  GLUCOSE 182* 120*  BUN 11 10  CREATININE 1.16 1.18  CALCIUM 9.3 8.9   Liver Function Tests No results for input(s): "AST", "ALT", "ALKPHOS", "BILITOT", "PROT", "ALBUMIN" in the last 72 hours. No results for input(s): "LIPASE", "AMYLASE" in the last 72 hours. High Sensitivity Troponin:   Recent Labs  Lab 09/11/22 0010 09/11/22 0236 09/11/22 0551 09/11/22 0814 09/11/22 1242  TROPONINIHS 379* 1,037* 1,891* 3,326* 3,952*    BNP Invalid input(s): "POCBNP" D-Dimer No results for input(s): "DDIMER" in the last 72 hours. Hemoglobin A1C Recent Labs  09/11/22 0236  HGBA1C 6.5*   Fasting Lipid Panel Recent Labs    09/11/22 0236  CHOL 198  HDL 40*  LDLCALC 152*  TRIG 31  CHOLHDL 5.0   Thyroid Function Tests Recent Labs    09/11/22 0236  TSH 0.476   _____________  DG Chest 2 View  Result Date: 09/10/2022 CLINICAL DATA:  Chest pain EXAM: CHEST - 2 VIEW COMPARISON:  CTA chest dated 11/09/2020 FINDINGS: Lungs are clear.  No pleural effusion or pneumothorax. The heart is normal in size. Degenerative changes of the visualized thoracolumbar spine. IMPRESSION: Normal chest radiographs. Electronically Signed   By: Julian Hy M.D.   On: 09/10/2022 20:37   Disposition   Pt is being discharged home today in good condition.  Follow-up Plans & Appointments     Follow-up Information     Lenna Sciara, NP Follow up on 09/21/2022.   Specialties: Nurse Practitioner, Family Medicine Why: at 10:50am for your follow up appt. Contact information: [address] [place] [address] [phone]                 Discharge Instructions     AMB Referral to Advanced Lipid Disorders Clinic   Complete by: As directed    Internal Lipid Clinic Referral Scheduling  Internal lipid clinic referrals are providers within [facility], who wish to refer established patients for routine management (help in starting PCSK9 inhibitor therapy) or advanced therapies.  Internal MD referral criteria:              1. All patients with LDL>190 mg/dL  2. All patients with Triglycerides >500 mg/dL  3. Patients with suspected or confirmed heterozygous familial hyperlipidemia (HeFH) or homozygous familial hyperlipidemia (HoFH)  4. Patients with family history of suspicious for genetic dyslipidemia desiring genetic testing  5. Patients refractory to standard guideline based therapy  6. Patients with statin intolerance (failed 2 statins, one of which must be a high potency statin)  7. Patients who the provider desires to be seen by MD   Internal PharmD referral criteria:   1. Follow-up patients for medication management  2. Follow-up for compliance monitoring  3. Patients for drug education  4. Patients with statin intolerance  5. PCSK9 inhibitor education and prior authorization approvals  6. Patients with triglycerides <500 mg/dL  External Lipid Clinic Referral  External lipid clinic referrals are for providers outside of [facility], considered new clinic patients - automatically routed to MD schedule   Amb Referral to Cardiac Rehabilitation   Complete by: As directed    Diagnosis: NSTEMI   After initial evaluation and assessments completed: Virtual Based Care may be provided alone or in conjunction with Phase 2 Cardiac Rehab based on patient barriers.: Yes   Intensive Cardiac Rehabilitation (ICR) [place] location only OR Traditional Cardiac Rehabilitation (TCR) *If criteria for ICR are not met will enroll in TCR [facility] Transitional [facility] & Rehabilitation only): Yes        Discharge Medications   Allergies as of 09/13/2022   No Known  Allergies      Medication List     STOP taking these medications    naproxen sodium 220 MG tablet Commonly known as: ALEVE       TAKE these medications    aspirin 81 MG chewable tablet Chew 1 tablet (81 mg total) by mouth daily. Start taking on: September 14, 2022   atorvastatin 80 MG tablet Commonly known as: LIPITOR Take 1 tablet (80 mg total) by mouth daily. Start taking  on: September 14, 2022   blood glucose meter kit and supplies Dispense based on patient and insurance preference. Use up to four times daily as directed. (FOR ICD-10 E10.9, E11.9).   clopidogrel 75 MG tablet Commonly known as: PLAVIX Take 1 tablet (75 mg total) by mouth daily with breakfast. Start taking on: September 14, 2022   dapagliflozin propanediol 10 MG Tabs tablet Commonly known as: FARXIGA Take 1 tablet (10 mg total) by mouth daily. Start taking on: September 14, 2022   metFORMIN 500 MG tablet Commonly known as: GLUCOPHAGE Take 500 mg by mouth 2 (two) times daily with a meal.   metoprolol tartrate 25 MG tablet Commonly known as: LOPRESSOR Take 0.5 tablets (12.5 mg total) by mouth 2 (two) times daily.   nitroGLYCERIN 0.4 MG SL tablet Commonly known as: NITROSTAT Place 1 tablet (0.4 mg total) under the tongue every 5 (five) minutes x 3 doses as needed for chest pain.           Outstanding Labs/Studies   FLP/LFTs in 8 weeks  Duration of Discharge Encounter   Greater than 30 minutes including physician time.  Signed, Reino Bellis, NP 09/13/2022, 3:06 PM  ATTENDING ATTESTATION  I have seen, examined and evaluated the patient this morning on rounds along with Reino Bellis, NP-C.  After reviewing all the available data and chart, we discussed the patients laboratory, study & physical findings as well as symptoms in detail.   I agree with her summary.  The assessment plan and recommendations were per my last progress note. He was doing well this morning feeling fine without  any major issues.  He has been up ambulating having been off heparin.  No chest pain or dyspnea.  Initially no arrhythmia.  He was ready for discharge, and I was contacted by his nurse.  There is concern for some type of arrhythmia.  But I went to see him he was completely stable and on telemetry he had evidence of what looks like a rate related right bundle branch block with normal rhythm narrow complex followed by a somewhat faster rhythm wider complex RBBB.  He subsequent went back right into the narrow complex lower rhythm after a minute or so.  He was completely asymptomatic, he indicated at the time that this was noted, he was in the process of a relatively heated argument with a coworker.  Added diagnosis to his discharge diagnosis is rate related right bundle branch block.  More than likely he did suffer some conduction fiber injury in the setting of his myocardial infarction.  At baseline with slower heart rates he is still with a normal narrow complex indicating that although disease, the right bundle is still able to conduct at slower heart rates.  This could potentially recover as he recovers post MI.    Leonie Man, MD, MS Glenetta Hew, M.D., M.S. Interventional Cardiologist  [place]  Pager # [phone] Phone # [phone] [address] [place], [place] [address]

## 2022-09-13 NOTE — TOC Benefit Eligibility Note (Signed)
Patient Teacher, English as a foreign language completed.    The patient is currently admitted and upon discharge could be taking Jardiance 10 mg.  The current 30 day co-pay is $290.98.   The patient is currently admitted and upon discharge could be taking Farxiga 10 mg.  The current 30 day co-pay is $277.27.   The patient is insured through [place], [place] Patient Advocate Specialist [place] Patient Advocate Team Direct Number: [phone]  Fax: [phone]

## 2022-09-21 ENCOUNTER — Ambulatory Visit: Payer: BLUE CROSS/BLUE SHIELD | Attending: Nurse Practitioner | Admitting: Nurse Practitioner

## 2022-09-21 ENCOUNTER — Encounter: Payer: Self-pay | Admitting: Nurse Practitioner

## 2022-09-21 VITALS — BP 120/80 | HR 70 | Ht 69.0 in | Wt 220.4 lb

## 2022-09-21 DIAGNOSIS — I252 Old myocardial infarction: Secondary | ICD-10-CM

## 2022-09-21 DIAGNOSIS — E785 Hyperlipidemia, unspecified: Secondary | ICD-10-CM | POA: Diagnosis not present

## 2022-09-21 DIAGNOSIS — E1159 Type 2 diabetes mellitus with other circulatory complications: Secondary | ICD-10-CM | POA: Diagnosis not present

## 2022-09-21 DIAGNOSIS — I4510 Unspecified right bundle-branch block: Secondary | ICD-10-CM

## 2022-09-21 DIAGNOSIS — I2510 Atherosclerotic heart disease of native coronary artery without angina pectoris: Secondary | ICD-10-CM | POA: Diagnosis not present

## 2022-09-21 NOTE — Patient Instructions (Signed)
Medication Instructions:  Your physician recommends that you continue on your current medications as directed. Please refer to the Current Medication list given to you today.   *If you need a refill on your cardiac medications before your next appointment, please call your pharmacy*   Lab Work: NONE ordered at this time of appointment   If you have labs (blood work) drawn today and your tests are completely normal, you will receive your results only by: MyChart Message (if you have MyChart) OR A paper copy in the mail If you have any lab test that is abnormal or we need to change your treatment, we will call you to review the results.   Testing/Procedures: NONE ordered at this time of appointment     Follow-Up: At [facility], you and your health needs are our priority.  As part of our continuing mission to provide you with exceptional heart care, we have created designated Provider Care Teams.  These Care Teams include your primary Cardiologist (physician) and Advanced Practice Providers (APPs -  Physician Assistants and Nurse Practitioners) who all work together to provide you with the care you need, when you need it.  We recommend signing up for the patient portal called "MyChart".  Sign up information is provided on this After Visit Summary.  MyChart is used to connect with patients for Virtual Visits (Telemedicine).  Patients are able to view lab/test results, encounter notes, upcoming appointments, etc.  Non-urgent messages can be sent to your provider as well.   To learn more about what you can do with MyChart, go to [email or web address].    Your next appointment:   6-8 week(s)  The format for your next appointment:   In Person  Provider:   Emily Monge, NP        Other Instructions   Important Information About Sugar       

## 2022-09-21 NOTE — Progress Notes (Signed)
Office Visit    Patient Name: Benjamin Munoz Date of Encounter: 09/21/2022  Primary Care Provider:  Pcp, No Primary Cardiologist:  Glenetta Hew, MD  Chief Complaint    61 year old male with a history of CAD, rate dependent RBBB, hyperlipidemia, and type 2 diabetes who presents for hospital follow-up related to CAD s/p NSTEMI.   Past Medical History    Past Medical History:  Diagnosis Date   Coronary artery disease involving native coronary artery of native heart with unstable angina pectoris ([place]) 09/11/2022   Cath with mid-distal RCA TO 100% ->unsuccessful PCI. L-R collaterals noted, Co-dominant. Basal Inferior HK, normal LVEF     COVID-19 long hauler manifesting chronic dyspnea/chronic hypoxic respiratory failure 11/04/2020   Diabetes mellitus, type II ([place]) 09/11/2022   Hyperlipidemia associated with type 2 diabetes mellitus ([place]) 09/11/2022   NSTEMI (non-ST elevated myocardial infarction) ([place]) 09/11/2022   Cath - small caliber, ? Co-dominant RCA 100% (unable to cross). Echo EF ~55%, inferior posterior hypokinesis    Past Surgical History:  Procedure Laterality Date   CORONARY STENT INTERVENTION N/A 09/11/2022   Procedure: CORONARY STENT INTERVENTION;  Surgeon: Belva Crome, MD;  Location: [place] CV LAB;  Service: Cardiovascular;  Laterality: N/A;   LEFT HEART CATH AND CORONARY ANGIOGRAPHY N/A 09/11/2022   Procedure: LEFT HEART CATH AND CORONARY ANGIOGRAPHY;  Surgeon: Belva Crome, MD;  Location: [place] CV LAB;  Service: Cardiovascular;  Laterality: N/A;    Allergies  No Known Allergies  History of Present Illness    61 year old male with the above past medical history including CAD, rate dependent RBBB, hyperlipidemia, and type 2 diabetes.  He presented to the ED on 09/10/2022 with sudden onset severe chest tightness with associated nausea and tingling of his left arm while he was driving home from work.  Troponin peaked at 3952.  Cardiology was  consulted in the setting of NSTEMI.  He underwent cardiac catheterization which showed occlusion of the m/d RCA, unable to advance PCI wire into territory.  Attempts were aborted.  There were faint distal collaterals to distal RCA. Recommendations were made for medical therapy.  He was placed on DAPT with aspirin and Plavix along with statin and beta-blocker. Echocardiogram showed EF 55%, hypokinetic inferior wall, no significant valvular abnormalities. He was discharged home in stable condition on 09/13/2022.  He presents today for follow-up accompanied by his wife.  Since his hospitalization he has been stable from a cardiac standpoint.  He denies any chest pain or dyspnea. He is working on gradually increasing his activity. He reports tolerating his medications well though he does note some mild lightheadedness early in the mornings.  BP has been stable. Otherwise, he reports feeling well denies any additional concerns today.  Home Medications    Current Outpatient Medications  Medication Sig Dispense Refill   aspirin 81 MG chewable tablet Chew 1 tablet (81 mg total) by mouth daily. 90 tablet 2   atorvastatin (LIPITOR) 80 MG tablet Take 1 tablet (80 mg total) by mouth daily. 90 tablet 1   blood glucose meter kit and supplies Dispense based on patient and insurance preference. Use up to four times daily as directed. (FOR ICD-10 E10.9, E11.9). 1 each 0   clopidogrel (PLAVIX) 75 MG tablet Take 1 tablet (75 mg total) by mouth daily with breakfast. 90 tablet 2   dapagliflozin propanediol (FARXIGA) 10 MG TABS tablet Take 1 tablet (10 mg total) by mouth daily. 90 tablet 0   metFORMIN (GLUCOPHAGE) 500  MG tablet Take 500 mg by mouth 2 (two) times daily with a meal.     metoprolol tartrate (LOPRESSOR) 25 MG tablet Take 0.5 tablets (12.5 mg total) by mouth 2 (two) times daily. 90 tablet 0   nitroGLYCERIN (NITROSTAT) 0.4 MG SL tablet Place 1 tablet (0.4 mg total) under the tongue every 5 (five) minutes x 3  doses as needed for chest pain. 25 tablet 2   No current facility-administered medications for this visit.     Review of Systems    He denies chest pain, palpitations, dyspnea, pnd, orthopnea, n, v, dizziness, syncope, edema, weight gain, or early satiety. All other systems reviewed and are otherwise negative except as noted above.     Cardiac Rehabilitation Eligibility Assessment  The patient is ready to start cardiac rehabilitation from a cardiac standpoint.    Physical Exam    VS:  BP 120/80   Pulse 70   Ht _0  (1.753 m)   Wt 220 lb 6.4 oz (100 kg)   SpO2 97%   BMI 32.55 kg/m   GEN: Well nourished, well developed, in no acute distress. HEENT: normal. Neck: Supple, no JVD, carotid bruits, or masses. Cardiac: RRR, no murmurs, rubs, or gallops. No clubbing, cyanosis, edema.  Radials/DP/PT 2+ and equal bilaterally. R radial cath site without bruising, bleeding or hematoma.  Respiratory:  Respirations regular and unlabored, clear to auscultation bilaterally. GI: Soft, nontender, nondistended, BS + x 4. MS: no deformity or atrophy. Skin: warm and dry, no rash. Neuro:  Strength and sensation are intact. Psych: Normal affect.  Accessory Clinical Findings    ECG personally reviewed by me today -NSR, 61 bpm, RBBB- no acute changes.   Lab Results  Component Value Date   WBC 12.2 (H) 09/13/2022   HGB 13.6 09/13/2022   HCT 39.7 09/13/2022   MCV 86.9 09/13/2022   PLT 217 09/13/2022   Lab Results  Component Value Date   CREATININE 1.18 09/12/2022   BUN 10 09/12/2022   NA 137 09/12/2022   K 3.9 09/12/2022   CL 106 09/12/2022   CO2 23 09/12/2022   Lab Results  Component Value Date   ALT 38 11/13/2020   AST 17 11/13/2020   ALKPHOS 47 11/13/2020   BILITOT 0.8 11/13/2020   Lab Results  Component Value Date   CHOL 198 09/11/2022   HDL 40 (L) 09/11/2022   LDLCALC 152 (H) 09/11/2022   TRIG 31 09/11/2022   CHOLHDL 5.0 09/11/2022    Lab Results  Component Value  Date   HGBA1C 6.5 (H) 09/11/2022    Assessment & Plan    1. CAD: S/p recent NSTEMI. Cath revealed occlusion of the m/d RCA, unable to advance PCI wire to territory, faint distal collaterals.  Medical therapy was advised. Stable with no anginal symptoms.  Continue aspirin, Plavix x3 months, followed by Plavix alone indefinitely.  Continue metoprolol, Lipitor.  Discussed activity progression, ED precautions.  He may return to work on 10/05/2022.  2. RBBB: Rate dependent, stable.  3. Hyperlipidemia: LDL was 152 09/11/2022.  He was started on an Lipitor.  Plan for repeat lipids, l LFTs at next follow-up visit.  If LDL remains elevated above goal, will likely need to consider [place], possible referral to lipid clinic Pharm.D. for consideration of PCSK9 inhibitor.  Continue aspirin, Plavix, Lipitor.  4. Type 2 diabetes: A1c was 6.5 on 09/11/2022.  Monitored and managed per PCP.  He was recently started on Farxiga.  Continue metformin.  5. Disposition:  Follow-up in 6-8 weeks.      Lenna Sciara, NP 09/21/2022, 12:25 PM

## 2022-11-04 ENCOUNTER — Telehealth (HOSPITAL_COMMUNITY): Payer: Self-pay

## 2022-11-04 NOTE — Telephone Encounter (Signed)
Pt insurance is active and benefits verified through [employer]. Pt insurance is OON and does not have OON benefits.   Hop/[employer], HER#[id number], 11/04/22

## 2022-11-04 NOTE — Telephone Encounter (Signed)
Called and spoke with pt in regards to CR, pt not able to participate due to insurance OON.   Closed referral

## 2022-11-07 ENCOUNTER — Ambulatory Visit: Payer: BLUE CROSS/BLUE SHIELD | Attending: Nurse Practitioner | Admitting: Nurse Practitioner

## 2022-11-07 ENCOUNTER — Encounter: Payer: Self-pay | Admitting: Nurse Practitioner

## 2022-11-07 VITALS — BP 120/82 | HR 96 | Ht 70.0 in | Wt 218.6 lb

## 2022-11-07 DIAGNOSIS — E785 Hyperlipidemia, unspecified: Secondary | ICD-10-CM | POA: Diagnosis not present

## 2022-11-07 DIAGNOSIS — I4510 Unspecified right bundle-branch block: Secondary | ICD-10-CM

## 2022-11-07 DIAGNOSIS — E1159 Type 2 diabetes mellitus with other circulatory complications: Secondary | ICD-10-CM | POA: Diagnosis not present

## 2022-11-07 DIAGNOSIS — I2510 Atherosclerotic heart disease of native coronary artery without angina pectoris: Secondary | ICD-10-CM

## 2022-11-07 MED ORDER — METOPROLOL TARTRATE 25 MG PO TABS: 12.5000 mg | ORAL_TABLET | Freq: Two times a day (BID) | ORAL | 3 refills | Status: AC

## 2022-11-07 MED ORDER — CLOPIDOGREL BISULFATE 75 MG PO TABS: 75.0000 mg | ORAL_TABLET | Freq: Every day | ORAL | 3 refills | Status: AC

## 2022-11-07 NOTE — Progress Notes (Signed)
Office Visit    Patient Name: Benjamin Munoz Date of Encounter: 11/07/2022  Primary Care Provider:  Pcp, No Primary Cardiologist:  Glenetta Hew, MD  Chief Complaint     61 year old male with a history of CAD, rate dependent RBBB, hyperlipidemia, and type 2 diabetes who presents fo follow-up related to CAD.    Past Medical History    Past Medical History:  Diagnosis Date   Coronary artery disease involving native coronary artery of native heart with unstable angina pectoris ([place]) 09/11/2022   Cath with mid-distal RCA TO 100% ->unsuccessful PCI. L-R collaterals noted, Co-dominant. Basal Inferior HK, normal LVEF     COVID-19 long hauler manifesting chronic dyspnea/chronic hypoxic respiratory failure 11/04/2020   Diabetes mellitus, type II ([place]) 09/11/2022   Hyperlipidemia associated with type 2 diabetes mellitus ([place]) 09/11/2022   NSTEMI (non-ST elevated myocardial infarction) ([place]) 09/11/2022   Cath - small caliber, ? Co-dominant RCA 100% (unable to cross). Echo EF ~55%, inferior posterior hypokinesis    Past Surgical History:  Procedure Laterality Date   CORONARY STENT INTERVENTION N/A 09/11/2022   Procedure: CORONARY STENT INTERVENTION;  Surgeon: Belva Crome, MD;  Location: [facility];  Service: Cardiovascular;  Laterality: N/A;   LEFT HEART CATH AND CORONARY ANGIOGRAPHY N/A 09/11/2022   Procedure: LEFT HEART CATH AND CORONARY ANGIOGRAPHY;  Surgeon: Belva Crome, MD;  Location: [facility];  Service: Cardiovascular;  Laterality: N/A;    Allergies  No Known Allergies  History of Present Illness    61 year old male with the above past medical history including CAD, rate dependent RBBB, hyperlipidemia, and type 2 diabetes.   He was hospitalized in 08/2022 in the setting of NSTEMI. Cardiac catheterization revealed occlusion of the m/d RCA, unable to advance PCI wire into territory. Attempts were aborted.  There were faint distal collaterals to distal  RCA medical therapy was recommended.  He was placed on DAPT with aspirin and Plavix along with statin and beta-blocker. Echocardiogram showed EF 55%, hypokinetic inferior wall, no significant valvular abnormalities.  He was last seen in the office on 09/21/2022 and was stable from a cardiac standpoint.  He denied symptoms concerning for angina.   He presents today for follow-up. Since his last visit he has done well from a cardiac standpoint.  He is back to work, he denies symptoms concerning for angina.  He is making efforts to improve his diet.  Overall, he reports feeling well.  Home Medications    Current Outpatient Medications  Medication Sig Dispense Refill   aspirin 81 MG chewable tablet Chew 1 tablet (81 mg total) by mouth daily. 90 tablet 2   atorvastatin (LIPITOR) 80 MG tablet Take 1 tablet (80 mg total) by mouth daily. 90 tablet 1   blood glucose meter kit and supplies Dispense based on patient and insurance preference. Use up to four times daily as directed. (FOR ICD-10 E10.9, E11.9). 1 each 0   dapagliflozin propanediol (FARXIGA) 10 MG TABS tablet Take 1 tablet (10 mg total) by mouth daily. 90 tablet 0   metFORMIN (GLUCOPHAGE) 500 MG tablet Take 500 mg by mouth 2 (two) times daily with a meal.     nitroGLYCERIN (NITROSTAT) 0.4 MG SL tablet Place 1 tablet (0.4 mg total) under the tongue every 5 (five) minutes x 3 doses as needed for chest pain. 25 tablet 2   clopidogrel (PLAVIX) 75 MG tablet Take 1 tablet (75 mg total) by mouth daily with breakfast. 90 tablet 3  metoprolol tartrate (LOPRESSOR) 25 MG tablet Take 0.5 tablets (12.5 mg total) by mouth 2 (two) times daily. 180 tablet 3   No current facility-administered medications for this visit.     Review of Systems    He denies chest pain, palpitations, dyspnea, pnd, orthopnea, n, v, dizziness, syncope, edema, weight gain, or early satiety. All other systems reviewed and are otherwise negative except as noted above.   Physical  Exam    VS:  BP 120/82   Pulse 96   Ht _0  (1.778 m)   Wt 218 lb 9.6 oz (99.2 kg)   SpO2 98%   BMI 31.37 kg/m  GEN: Well nourished, well developed, in no acute distress. HEENT: normal. Neck: Supple, no JVD, carotid bruits, or masses. Cardiac: RRR, no murmurs, rubs, or gallops. No clubbing, cyanosis, edema.  Radials/DP/PT 2+ and equal bilaterally.  Respiratory:  Respirations regular and unlabored, clear to auscultation bilaterally. GI: Soft, nontender, nondistended, BS + x 4. MS: no deformity or atrophy. Skin: warm and dry, no rash. Neuro:  Strength and sensation are intact. Psych: Normal affect.  Accessory Clinical Findings    ECG personally reviewed by me today -no EKG in office today.   Lab Results  Component Value Date   WBC 12.2 (H) 09/13/2022   HGB 13.6 09/13/2022   HCT 39.7 09/13/2022   MCV 86.9 09/13/2022   PLT 217 09/13/2022   Lab Results  Component Value Date   CREATININE 1.18 09/12/2022   BUN 10 09/12/2022   NA 137 09/12/2022   K 3.9 09/12/2022   CL 106 09/12/2022   CO2 23 09/12/2022   Lab Results  Component Value Date   ALT 38 11/13/2020   AST 17 11/13/2020   ALKPHOS 47 11/13/2020   BILITOT 0.8 11/13/2020   Lab Results  Component Value Date   CHOL 198 09/11/2022   HDL 40 (L) 09/11/2022   LDLCALC 152 (H) 09/11/2022   TRIG 31 09/11/2022   CHOLHDL 5.0 09/11/2022    Lab Results  Component Value Date   HGBA1C 6.5 (H) 09/11/2022    Assessment & Plan    1. CAD: S/p NSTEMI in 08/2022. Cath revealed occlusion of the m/d RCA, unable to advance PCI wire to territory, faint distal collaterals.  Medical therapy was advised. Stable with no anginal symptoms.  Continue aspirin, Plavix x3 months, followed by Plavix alone indefinitely.  Continue metoprolol, Lipitor.   2. RBBB: Rate dependent, stable.   3. Hyperlipidemia: LDL was 152 09/11/2022.  He was started on an Lipitor.  Will  repeat lipids, LFTs.  It appears he was referred to lipid clinic Pharm.D.  He has an appointment with them on 11/14/2022.  Continue aspirin, Plavix, Lipitor.   4. Type 2 diabetes: A1c was 6.5 on 09/11/2022.  Monitored and managed per PCP.  He was recently started on Farxiga.  Continue metformin.   5. Disposition: Follow-up in 4-6 months with Dr. Ellyn Hack.      Lenna Sciara, NP 11/07/2022, 12:34 PM

## 2022-11-07 NOTE — Patient Instructions (Addendum)
Medication Instructions:  Your physician recommends that you continue on your current medications as directed. Please refer to the Current Medication list given to you today.   *If you need a refill on your cardiac medications before your next appointment, please call your pharmacy*   Lab Work: Your physician recommends that you complete lab work today. Fasting Lipid panel & LFTs  If you have labs (blood work) drawn today and your tests are completely normal, you will receive your results only by: MyChart Message (if you have MyChart) OR A paper copy in the mail If you have any lab test that is abnormal or we need to change your treatment, we will call you to review the results.   Testing/Procedures: NONE ordered at this time of appointment     Follow-Up: At [facility], you and your health needs are our priority.  As part of our continuing mission to provide you with exceptional heart care, we have created designated Provider Care Teams.  These Care Teams include your primary Cardiologist (physician) and Advanced Practice Providers (APPs -  Physician Assistants and Nurse Practitioners) who all work together to provide you with the care you need, when you need it.  We recommend signing up for the patient portal called "MyChart".  Sign up information is provided on this After Visit Summary.  MyChart is used to connect with patients for Virtual Visits (Telemedicine).  Patients are able to view lab/test results, encounter notes, upcoming appointments, etc.  Non-urgent messages can be sent to your provider as well.   To learn more about what you can do with MyChart, go to [email or web address].    Your next appointment:   4-6 month(s)  The format for your next appointment:   In Person  Provider:   Bryan Lemma, MD     Other Instructions   Important Information About Sugar

## 2022-11-08 LAB — LIPID PANEL
Chol/HDL Ratio: 3.1 ratio (ref 0.0–5.0)
Cholesterol, Total: 117 mg/dL (ref 100–199)
HDL: 38 mg/dL — ABNORMAL LOW (ref >39)
LDL Chol Calc (NIH): 65 mg/dL (ref 0–99)
Triglycerides: 65 mg/dL (ref 0–149)
VLDL Cholesterol Cal: 14 mg/dL (ref 5–40)

## 2022-11-08 LAB — HEPATIC FUNCTION PANEL
ALT: 20 IU/L (ref 0–44)
AST: 20 IU/L (ref 0–40)
Albumin: 4.5 g/dL (ref 3.9–4.9)
Alkaline Phosphatase: 100 IU/L (ref 44–121)
Bilirubin Total: 0.5 mg/dL (ref 0.0–1.2)
Bilirubin, Direct: 0.17 mg/dL (ref 0.00–0.40)
Total Protein: 7.3 g/dL (ref 6.0–8.5)

## 2022-11-10 ENCOUNTER — Telehealth: Payer: Self-pay

## 2022-11-10 NOTE — Telephone Encounter (Signed)
Spoke with Benjamin Munoz. Benjamin Munoz was notified of results and will follow up as directed. Benjamin Munoz said he wasn't able to get his Metoprolol because the pharmacy stated it was too early for pickup. I did speak with pts pharmacy, Benjamin Munoz can pay the cash price of $11.99 or wait until 11/22/22. Benjamin Munoz was advised when he gets the Metoprolol to take 1/2 tab twice daily and not a whole tablet twice daily.   Bernadene Person NP is aware of the above message.

## 2022-11-14 ENCOUNTER — Ambulatory Visit: Payer: BLUE CROSS/BLUE SHIELD | Attending: Cardiology | Admitting: Student

## 2022-11-14 DIAGNOSIS — E1169 Type 2 diabetes mellitus with other specified complication: Secondary | ICD-10-CM | POA: Diagnosis not present

## 2022-11-14 DIAGNOSIS — E785 Hyperlipidemia, unspecified: Secondary | ICD-10-CM | POA: Diagnosis not present

## 2022-11-14 MED ORDER — EZETIMIBE 10 MG PO TABS: 10.0000 mg | ORAL_TABLET | Freq: Every day | ORAL | 3 refills | Status: AC

## 2022-11-14 MED ORDER — DAPAGLIFLOZIN PROPANEDIOL 10 MG PO TABS: 10.0000 mg | ORAL_TABLET | Freq: Every day | ORAL | 3 refills | Status: AC

## 2022-11-14 NOTE — Assessment & Plan Note (Signed)
Assessment:  LDL goal: < 55 mg/dl last LDLc  65  mg/dl (12 /6384) Tolerates high intensity statins well without any side effects  LDLc still above the goal  Discussed next potential options ezetimibe, it's cost, dosing efficacy, side effects   Plan: Continue taking atorvastatin 80 mg daily  Start taking Ezetimibe 10 mg daily  repeat fasting lipid lab in 3 months

## 2022-11-14 NOTE — Patient Instructions (Signed)
Changes made by your pharmacist Carmela Hurt, PharmD at today's visit:    Instructions/Changes  (what do you need to do) Your Notes  (what you did and when you did it)  1.Continue taking atorvastatin 80 mg daily   2.Start taking Ezetimibe 10 mg daily       If you have any questions or concerns please use My Chart to send questions or call the office at [phone]

## 2022-11-14 NOTE — Progress Notes (Signed)
Patient ID: Benjamin Munoz                 DOB: 01-16-1961                    MRN: [id number]      HPI: Benjamin Munoz is a 61 y.o. male patient referred to lipid clinic by Harlan Stains NP. PMH is significant for Hx of NSTEMI, CAD, DM.   LDL was 152 09/11/2022.  He was started on an Lipitor in Oct 2023. Lipid improved but still above th goal. Hepatic function came back normal on high intensity statin. Patient reports tolerating Lipitor 80 mg well without side effects. Discussed the next step in the treatment - adding ezetimibe dosing, side effects and potential decreases in LDL cholesterol.patient mentioned he is out of [place] if we could provide more refills.    Diet: eats healthy since even in Oct. Low carb, low salt, low fat. Eats home cooked meals.   Exercise: walk - 5 miles every day   Family History: dad - lung cancer at age of 22, smoked 1-2 pack a day. Mother- asthma  Social History:  Alcohol: none Smoking: none  Recreational drug: none    Labs: Lipid Panel     Component Value Date/Time   CHOL 117 11/07/2022 1125   TRIG 65 11/07/2022 1125   HDL 38 (L) 11/07/2022 1125   CHOLHDL 3.1 11/07/2022 1125   CHOLHDL 5.0 09/11/2022 0236   VLDL 6 09/11/2022 0236   LDLCALC 65 11/07/2022 1125   LABVLDL 14 11/07/2022 1125    Past Medical History:  Diagnosis Date   Coronary artery disease involving native coronary artery of native heart with unstable angina pectoris ([place]) 09/11/2022   Cath with mid-distal RCA TO 100% ->unsuccessful PCI. L-R collaterals noted, Co-dominant. Basal Inferior HK, normal LVEF     COVID-19 long hauler manifesting chronic dyspnea/chronic hypoxic respiratory failure 11/04/2020   Diabetes mellitus, type II ([place]) 09/11/2022   Hyperlipidemia associated with type 2 diabetes mellitus ([place]) 09/11/2022   NSTEMI (non-ST elevated myocardial infarction) ([place]) 09/11/2022   Cath - small caliber, ? Co-dominant RCA 100% (unable to cross). Echo EF ~55%, inferior  posterior hypokinesis     Current Outpatient Medications on File Prior to Visit  Medication Sig Dispense Refill   aspirin 81 MG chewable tablet Chew 1 tablet (81 mg total) by mouth daily. 90 tablet 2   atorvastatin (LIPITOR) 80 MG tablet Take 1 tablet (80 mg total) by mouth daily. 90 tablet 1   blood glucose meter kit and supplies Dispense based on patient and insurance preference. Use up to four times daily as directed. (FOR ICD-10 E10.9, E11.9). 1 each 0   clopidogrel (PLAVIX) 75 MG tablet Take 1 tablet (75 mg total) by mouth daily with breakfast. 90 tablet 3   dapagliflozin propanediol (FARXIGA) 10 MG TABS tablet Take 1 tablet (10 mg total) by mouth daily. 90 tablet 0   metFORMIN (GLUCOPHAGE) 500 MG tablet Take 500 mg by mouth 2 (two) times daily with a meal.     metoprolol tartrate (LOPRESSOR) 25 MG tablet Take 0.5 tablets (12.5 mg total) by mouth 2 (two) times daily. 180 tablet 3   nitroGLYCERIN (NITROSTAT) 0.4 MG SL tablet Place 1 tablet (0.4 mg total) under the tongue every 5 (five) minutes x 3 doses as needed for chest pain. 25 tablet 2   No current facility-administered medications on file prior to visit.    No Known Allergies  Problem  Hyperlipidemia Associated With Type 2 Diabetes Mellitus ([facility])   Current Medications: Atorvastatin 80 mg  Intolerances: none  Risk Factors: CAD, T2DM, history of NSTEMI  LDL goal: <55 mg/dl     Hyperlipidemia associated with type 2 diabetes mellitus ([place]) Assessment:  LDL goal: < 55 mg/dl last LDLc  65  mg/dl (12 /2023) Tolerates high intensity statins well without any side effects  LDLc still above the goal  Discussed next potential options ezetimibe, it's cost, dosing efficacy, side effects   Plan: Continue taking atorvastatin 80 mg daily  Start taking Ezetimibe 10 mg daily  repeat fasting lipid lab in 3 months  Thank you,  Cammy Copa, Pharm.D [facility] of [facility] [address], [place], [place] [address]  Phone: [phone]; Fax: [phone]

## 2023-03-20 ENCOUNTER — Ambulatory Visit: Payer: BLUE CROSS/BLUE SHIELD | Attending: Cardiology | Admitting: Cardiology

## 2023-03-20 NOTE — Progress Notes (Deleted)
Primary Care Provider: Pcp, No [facility] Cardiologist: Bryan Lemma, MD Electrophysiologist: None  Clinic Note: No chief complaint on file.   ===================================  ASSESSMENT/PLAN   Problem List Items Addressed This Visit   None   ===================================  HPI:    Benjamin Munoz is a 62 y.o. male with a PMH CAD non-STEMI (CTO RCA), DM-2, HTN & HLD who presents today for 16-month follow-up.  Recent Hospitalizations:  09/11/22: Admitted for non-STEMI.  Cath showed CTO of the RCA, unable to cross.  EF was 55% with inferoposterior hypokinesis.  RCA was felt to be codominant.  Plan was medical management.  Benjamin Munoz was last seen by Benjamin Person, NP on December 11 for a 45-month follow-up after initial post hospital follow-up  He was referred to lipid clinic and seen on November 15, 2023 3.  Was started on Zetia 10 mg along with atorvastatin 80 mg.  Current LDL was 65, plan was fasting lipid panel in 3 months.  Reviewed  CV studies:    The following studies were reviewed today: (if available, images/films reviewed: From Epic Chart or Care Everywhere) ***:  Interval History:   Benjamin Munoz   CV Review of Symptoms (Summary): Cardiovascular ROS: {roscv:310661}  REVIEWED OF SYSTEMS   ROS  I have reviewed and (if needed) personally updated the patient's problem list, medications, allergies, past medical and surgical history, social and family history.   PAST MEDICAL HISTORY   Past Medical History:  Diagnosis Date   Coronary artery disease involving native coronary artery of native heart with unstable angina pectoris ([facility]) 09/11/2022   Cath with mid-distal RCA TO 100% ->unsuccessful PCI. L-R collaterals noted, Co-dominant. Basal Inferior HK, normal LVEF     COVID-19 long hauler manifesting chronic dyspnea/chronic hypoxic respiratory failure 11/04/2020   Diabetes mellitus, type II ([facility]) 09/11/2022   Hyperlipidemia  associated with type 2 diabetes mellitus ([facility]) 09/11/2022   NSTEMI (non-ST elevated myocardial infarction) ([facility]) 09/11/2022   Cath - small caliber, ? Co-dominant RCA 100% (unable to cross). Echo EF ~55%, inferior posterior hypokinesis     PAST SURGICAL HISTORY   Past Surgical History:  Procedure Laterality Date   CORONARY STENT INTERVENTION N/A 09/11/2022   Procedure: CORONARY STENT INTERVENTION;  Surgeon: Lyn Records, MD;  Location: [facility];  Service: Cardiovascular;  Laterality: N/A;   LEFT HEART CATH AND CORONARY ANGIOGRAPHY N/A 09/11/2022   Procedure: LEFT HEART CATH AND CORONARY ANGIOGRAPHY;  Surgeon: Lyn Records, MD;  Location: [facility];  Service: Cardiovascular;  Laterality: N/A;    MEDICATIONS/ALLERGIES   No outpatient medications have been marked as taking for the 03/20/23 encounter (Appointment) with Marykay Lex, MD.   No Known Allergies  SOCIAL HISTORY/FAMILY HISTORY   Reviewed in Epic:  Pertinent findings:  Social History   Tobacco Use   Smoking status: Never   Smokeless tobacco: Never  Vaping Use   Vaping Use: Never used  Substance Use Topics   Alcohol use: No   Drug use: No   Social History   Social History Narrative   Not on file  Diet: eats healthy since even in Oct. Low carb, low salt, low fat. Eats home cooked meals.  Exercise: walk - 5 miles every day     OBJCTIVE -PE, EKG, labs   Wt Readings from Last 3 Encounters:  11/07/22 218 lb 9.6 oz (99.2 kg)  09/21/22 220 lb 6.4 oz (100 kg)  09/13/22 216 lb 0.8 oz (  98 kg)   Physical Exam: There were no vitals taken for this visit. Physical Exam   Adult ECG Report  Rate: *** ;  Rhythm: {rhythm:17366};   Narrative Interpretation: ***  Recent Labs:  ***  Lab Results  Component Value Date   CHOL 117 11/07/2022   HDL 38 (L) 11/07/2022   LDLCALC 65 11/07/2022   TRIG 65 11/07/2022   CHOLHDL 3.1 11/07/2022   Lab Results  Component Value Date   CREATININE 1.18  09/12/2022   BUN 10 09/12/2022   NA 137 09/12/2022   K 3.9 09/12/2022   CL 106 09/12/2022   CO2 23 09/12/2022      Latest Ref Rng & Units 09/13/2022    4:40 AM 09/12/2022    2:27 AM 09/11/2022    2:36 AM  CBC  WBC 4.0 - 10.5 K/uL 12.2  12.3  12.4   Hemoglobin 13.0 - 17.0 g/dL 09.8  11.9  14.7   Hematocrit 39.0 - 52.0 % 39.7  41.6  44.1   Platelets 150 - 400 K/uL 217  228  241     Lab Results  Component Value Date   HGBA1C 6.5 (H) 09/11/2022   Lab Results  Component Value Date   TSH 0.476 09/11/2022    ================================================== I spent a total of ***minutes with the patient spent in direct patient consultation.  Additional time spent with chart review  / charting (studies, outside notes, etc): *** min Total Time: *** min  Current medicines are reviewed at length with the patient today.  (+/- concerns) ***  Notice: This dictation was prepared with Dragon dictation along with smart phrase technology. Any transcriptional errors that result from this process are unintentional and may not be corrected upon review.  Studies Ordered:   No orders of the defined types were placed in this encounter.  No orders of the defined types were placed in this encounter.   Patient Instructions / Medication Changes & Studies & Tests Ordered   There are no Patient Instructions on file for this visit.     Marykay Lex, MD, MS Bryan Lemma, M.D., M.S. Interventional Cardiologist  [facility]  Pager # [phone] Phone # [phone] [address] [place], [place] [address]   Thank you for choosing [facility] [place]!!

## 2023-04-10 ENCOUNTER — Ambulatory Visit: Payer: BLUE CROSS/BLUE SHIELD | Attending: Internal Medicine

## 2023-04-10 NOTE — Progress Notes (Deleted)
Patient ID: Benjamin Munoz                 DOB: Apr 08, 1961                    MRN: [id number]     HPI: Benjamin Munoz is a 62 y.o. male patient referred to lipid clinic by ***. PMH is significant for   Current Medications:  Intolerances:  Risk Factors:  LDL goal:   Diet:   Exercise:   Family History:   Social History:   Labs: TC 170, Trigs 103, HDL 44, LDL 106  Past Medical History:  Diagnosis Date   Coronary artery disease involving native coronary artery of native heart with unstable angina pectoris (HCC) 09/11/2022   Cath with mid-distal RCA TO 100% ->unsuccessful PCI. L-R collaterals noted, Co-dominant. Basal Inferior HK, normal LVEF     COVID-19 long hauler manifesting chronic dyspnea/chronic hypoxic respiratory failure 11/04/2020   Diabetes mellitus, type II (HCC) 09/11/2022   Hyperlipidemia associated with type 2 diabetes mellitus (HCC) 09/11/2022   NSTEMI (non-ST elevated myocardial infarction) ([facility]) 09/11/2022   Cath - small caliber, ? Co-dominant RCA 100% (unable to cross). Echo EF ~55%, inferior posterior hypokinesis     Current Outpatient Medications on File Prior to Visit  Medication Sig Dispense Refill   Vitamin D, Ergocalciferol, (DRISDOL) 1.25 MG (50000 UNIT) CAPS capsule Take 50,000 Units by mouth every 7 (seven) days.     aspirin 81 MG chewable tablet Chew 1 tablet (81 mg total) by mouth daily. 90 tablet 2   atorvastatin (LIPITOR) 80 MG tablet Take 1 tablet (80 mg total) by mouth daily. 90 tablet 1   blood glucose meter kit and supplies Dispense based on patient and insurance preference. Use up to four times daily as directed. (FOR ICD-10 E10.9, E11.9). 1 each 0   clopidogrel (PLAVIX) 75 MG tablet Take 1 tablet (75 mg total) by mouth daily with breakfast. 90 tablet 3   dapagliflozin propanediol (FARXIGA) 10 MG TABS tablet Take 1 tablet (10 mg total) by mouth daily. 90 tablet 0   dapagliflozin propanediol (FARXIGA) 10 MG TABS tablet Take 1 tablet (10 mg  total) by mouth daily before breakfast. 90 tablet 3   ezetimibe (ZETIA) 10 MG tablet Take 1 tablet (10 mg total) by mouth daily. 90 tablet 3   metFORMIN (GLUCOPHAGE) 500 MG tablet Take 500 mg by mouth 2 (two) times daily with a meal.     metoprolol tartrate (LOPRESSOR) 25 MG tablet Take 0.5 tablets (12.5 mg total) by mouth 2 (two) times daily. 180 tablet 3   nitroGLYCERIN (NITROSTAT) 0.4 MG SL tablet Place 1 tablet (0.4 mg total) under the tongue every 5 (five) minutes x 3 doses as needed for chest pain. 25 tablet 2   No current facility-administered medications on file prior to visit.    No Known Allergies  Assessment/Plan:  1. Hyperlipidemia -  Reviewed options for lowering LDL cholesterol, including statins, ezetimibe, PCSK9 inhibitors, Nexletol/Nexlizet, and Leqvio. Discussed mechanisms of action, dosing, side effects and potential decreases in LDL cholesterol. Also reviewed cost information and potential options for patient assistance.

## 2023-04-11 ENCOUNTER — Encounter: Payer: Self-pay | Admitting: Cardiology

## 2023-11-09 ENCOUNTER — Other Ambulatory Visit (HOSPITAL_COMMUNITY): Payer: Self-pay

## 2024-02-14 ENCOUNTER — Other Ambulatory Visit (HOSPITAL_COMMUNITY): Payer: Self-pay

## 2024-06-30 ENCOUNTER — Ambulatory Visit: Admission: EM | Admit: 2024-06-30 | Discharge: 2024-06-30 | Disposition: A | Discharge status: Home / Self Care

## 2024-06-30 DIAGNOSIS — N481 Balanitis: Secondary | ICD-10-CM

## 2024-06-30 MED ORDER — FLUCONAZOLE 150 MG PO TABS: ORAL_TABLET | ORAL | 0 refills | Status: AC

## 2024-06-30 MED ORDER — MICONAZOLE NITRATE 2 % EX CREA: 1.0000 | TOPICAL_CREAM | Freq: Two times a day (BID) | CUTANEOUS | 0 refills | Status: AC

## 2024-06-30 NOTE — ED Triage Notes (Signed)
 I got a chemical burn on my penis from a public restroom, when sitting on the toilet the tip accidentally touched a freshly cleaned bowl (toilet) causing a burn/exposure to whatever was just used to clean it. No redness, rash, blisters, and I am uncircumcised so that is making it harder, I can't pull my foreskin back at all to clean it now. No fever.

## 2024-07-03 NOTE — ED Provider Notes (Signed)
 [name]    CSN: [id number] Arrival date & time: 06/30/24  1516      History   Chief Complaint Chief Complaint  Patient presents with   Skin Problem    HPI Benjamin Munoz is a 63 y.o. male.   Patient here today for evaluation of pain to his penile area.  He reports that he thought it might be related to a chemical burn after using public restroom when his penis accidentally touched the freshly clean toilet bowl.  He denies any redness but does have some white discharge and is unable to pull his foreskin back.  He denies any concerns for STDs as he is not sexually active.  He denies any fever.  He has not had any dysuria.  The history is provided by the patient.    Past Medical History:  Diagnosis Date   Coronary artery disease involving native coronary artery of native heart with unstable angina pectoris (HCC) 09/11/2022   Cath with mid-distal RCA TO 100% ->unsuccessful PCI. L-R collaterals noted, Co-dominant. Basal Inferior HK, normal LVEF     COVID-19 long hauler manifesting chronic dyspnea/chronic hypoxic respiratory failure 11/04/2020   Diabetes mellitus, type II (HCC) 09/11/2022   Hyperlipidemia associated with type 2 diabetes mellitus (HCC) 09/11/2022   NSTEMI (non-ST elevated myocardial infarction) (HCC) 09/11/2022   Cath - small caliber, ? Co-dominant RCA 100% (unable to cross). Echo EF ~55%, inferior posterior hypokinesis     Patient Active Problem List   Diagnosis Date Noted   NSTEMI (non-ST elevated myocardial infarction) (HCC) 09/11/2022   Diabetes mellitus, type II (HCC) 09/11/2022   Hyperlipidemia associated with type 2 diabetes mellitus (HCC) 09/11/2022   Coronary artery disease involving native coronary artery of native heart with unstable angina pectoris (HCC) 09/11/2022   NSVT (nonsustained ventricular tachycardia) (HCC) 09/11/2022   Thyroid nodule 11/05/2020   Acute respiratory failure with hypoxia (HCC) 11/05/2020   COVID-19 long  hauler manifesting chronic dyspnea/chronic hypoxic respiratory failure 11/04/2020    Past Surgical History:  Procedure Laterality Date   CORONARY STENT INTERVENTION N/A 09/11/2022   Procedure: CORONARY STENT INTERVENTION;  Surgeon: Claudene Victory ORN, MD;  Location: [facility];  Service: Cardiovascular;  Laterality: N/A;   LEFT HEART CATH AND CORONARY ANGIOGRAPHY N/A 09/11/2022   Procedure: LEFT HEART CATH AND CORONARY ANGIOGRAPHY;  Surgeon: Claudene Victory ORN, MD;  Location: [facility];  Service: Cardiovascular;  Laterality: N/A;       Home Medications    Prior to Admission medications   Medication Sig Start Date End Date Taking? Authorizing Provider  aspirin  81 MG chewable tablet Chew 1 tablet (81 mg total) by mouth daily. 09/14/22  Yes Henry Manuelita NOVAK, NP  aspirin  EC 81 MG tablet Take 81 mg by mouth daily.   Yes [provider]  atorvastatin  (LIPITOR ) 80 MG tablet Take 1 tablet (80 mg total) by mouth daily. 09/14/22  Yes Henry Manuelita NOVAK, NP  Continuous Glucose Sensor (FREESTYLE LIBRE 3 PLUS SENSOR) MISC Current Glucose: 210 (earlier) 03/06/24  Yes [provider]  Empagliflozin-metFORMIN  HCl ER 25-1000 MG TB24 Take 1 tablet by mouth daily. 06/05/24  Yes [provider]  fluconazole  (DIFLUCAN ) 150 MG tablet Take one tab PO today and repeat dose in 3 days if symptoms persist 06/30/24  Yes Billy Asberry FALCON, PA-C  metFORMIN  (GLUCOPHAGE ) 500 MG tablet Take 500 mg by mouth 2 (two) times daily with a meal.   Yes [provider]  metoprolol  tartrate (  LOPRESSOR ) 25 MG tablet Take 0.5 tablets (12.5 mg total) by mouth 2 (two) times daily. 11/07/22  Yes Monge, Damien BROCKS, NP  miconazole  (MICOTIN) 2 % cream Apply 1 Application topically 2 (two) times daily. 06/30/24  Yes Billy Asberry FALCON, PA-C  blood glucose meter kit and supplies Dispense based on patient and insurance preference. Use up to four times daily as directed. (FOR ICD-10 E10.9, E11.9). 11/13/20    Ghimire, Donalda HERO, MD  clopidogrel  (PLAVIX ) 75 MG tablet Take 1 tablet (75 mg total) by mouth daily with breakfast. 11/07/22   Daneen Damien BROCKS, NP  dapagliflozin  propanediol (FARXIGA ) 10 MG TABS tablet Take 1 tablet (10 mg total) by mouth daily. 09/14/22   Henry Manuelita NOVAK, NP  dapagliflozin  propanediol (FARXIGA ) 10 MG TABS tablet Take 1 tablet (10 mg total) by mouth daily before breakfast. 11/14/22   Tobie Robbi LABOR, MD  ezetimibe  (ZETIA ) 10 MG tablet Take 1 tablet (10 mg total) by mouth daily. 11/14/22   Anner Alm ORN, MD  nitroGLYCERIN  (NITROSTAT ) 0.4 MG SL tablet Place 1 tablet (0.4 mg total) under the tongue every 5 (five) minutes x 3 doses as needed for chest pain. 09/13/22   Henry Manuelita NOVAK, NP  Vitamin D, Ergocalciferol, (DRISDOL) 1.25 MG (50000 UNIT) CAPS capsule Take 50,000 Units by mouth every 7 (seven) days. 12/31/20   [provider]    Family History History reviewed. No pertinent family history.  Social History Social History   Tobacco Use   Smoking status: Never   Smokeless tobacco: Never  Vaping Use   Vaping status: Never Used  Substance Use Topics   Alcohol use: No   Drug use: No     Allergies   Patient has no known allergies.   Review of Systems Review of Systems  Constitutional:  Negative for chills and fever.  Eyes:  Negative for discharge and redness.  Respiratory:  Negative for shortness of breath.   Genitourinary:  Positive for penile pain. Negative for dysuria and penile swelling.  Skin:  Negative for wound.  Neurological:  Negative for numbness.     Physical Exam Triage Vital Signs ED Triage Vitals  Encounter Vitals Group     BP 06/30/24 1531 121/79     Girls Systolic BP Percentile --      Girls Diastolic BP Percentile --      Boys Systolic BP Percentile --      Boys Diastolic BP Percentile --      Pulse Rate 06/30/24 1531 87     Resp 06/30/24 1531 18     Temp 06/30/24 1531 98.5 F (36.9 C)     Temp Source 06/30/24 1531  Oral     SpO2 06/30/24 1531 95 %     Weight 06/30/24 1528 210 lb (95.3 kg)     Height 06/30/24 1528 5' 8 (1.727 m)     Head Circumference --      Peak Flow --      Pain Score 06/30/24 1524 8     Pain Loc --      Pain Education --      Exclude from Growth Chart --    No data found.  Updated Vital Signs BP 121/79 (BP Location: Left Arm)   Pulse 87   Temp 98.5 F (36.9 C) (Oral)   Resp 18   Ht 5' 8 (1.727 m)   Wt 210 lb (95.3 kg)   SpO2 95%   BMI 31.93 kg/m   Visual Acuity  Right Eye Distance:   Left Eye Distance:   Bilateral Distance:    Right Eye Near:   Left Eye Near:    Bilateral Near:     Physical Exam Vitals and nursing note reviewed.  Constitutional:      General: He is not in acute distress.    Appearance: Normal appearance. He is not ill-appearing.  HENT:     Head: Normocephalic and atraumatic.  Eyes:     Conjunctiva/sclera: Conjunctivae normal.  Cardiovascular:     Rate and Rhythm: Normal rate.  Pulmonary:     Effort: Pulmonary effort is normal. No respiratory distress.  Genitourinary:    Comments: Redell Dixon, CMA- Chaperone  White exudate noted to distal penis with mild foreskin retraction Neurological:     Mental Status: He is alert.  Psychiatric:        Mood and Affect: Mood normal.        Behavior: Behavior normal.        Thought Content: Thought content normal.      UC Treatments / Results  Labs (all labs ordered are listed, but only abnormal results are displayed) Labs Reviewed - No data to display  EKG   Radiology No results found.  Procedures Procedures (including critical care time)  Medications Ordered in UC Medications - No data to display  Initial Impression / Assessment and Plan / UC Course  I have reviewed the triage vital signs and the nursing notes.  Pertinent labs & imaging results that were available during my care of the patient were reviewed by me and considered in my medical decision making (see chart for  details).    Will treat to cover possible fungal etiology of symptoms given appearance and recommended evaluation by PCP if no improvement or with any worsening. Patient expresses understanding.   Final Clinical Impressions(s) / UC Diagnoses   Final diagnoses:  Balanitis   Discharge Instructions   None    ED Prescriptions     Medication Sig Dispense Auth. Provider   miconazole  (MICOTIN) 2 % cream Apply 1 Application topically 2 (two) times daily. 28.35 g Billy Stabs F, PA-C   fluconazole  (DIFLUCAN ) 150 MG tablet Take one tab PO today and repeat dose in 3 days if symptoms persist 2 tablet Billy Stabs FALCON, PA-C      PDMP not reviewed this encounter.   Billy Stabs FALCON, PA-C 07/03/24 [phone]

## 2024-11-25 ENCOUNTER — Other Ambulatory Visit (HOSPITAL_COMMUNITY): Payer: Self-pay
